# Patient Record
Sex: Female | Born: 1960 | Race: White | Hispanic: No | Smoking: Former smoker
Health system: Southern US, Community
[De-identification: ages and names within clinical notes are randomized; demographics above are authoritative.]

## PROBLEM LIST (undated history)

## (undated) DIAGNOSIS — E785 Hyperlipidemia, unspecified: Secondary | ICD-10-CM

## (undated) DIAGNOSIS — F329 Major depressive disorder, single episode, unspecified: Secondary | ICD-10-CM

## (undated) DIAGNOSIS — F32A Depression, unspecified: Secondary | ICD-10-CM

## (undated) DIAGNOSIS — K219 Gastro-esophageal reflux disease without esophagitis: Secondary | ICD-10-CM

## (undated) HISTORY — DX: Gastro-esophageal reflux disease without esophagitis: K21.9

## (undated) HISTORY — DX: Major depressive disorder, single episode, unspecified: F32.9

## (undated) HISTORY — PX: TONSILLECTOMY: SUR1361

## (undated) HISTORY — DX: Depression, unspecified: F32.A

## (undated) HISTORY — DX: Hyperlipidemia, unspecified: E78.5

---

## 2005-11-10 ENCOUNTER — Ambulatory Visit: Payer: Self-pay

## 2008-04-03 ENCOUNTER — Ambulatory Visit: Payer: Self-pay

## 2009-04-18 ENCOUNTER — Ambulatory Visit: Payer: Self-pay | Admitting: Certified Nurse Midwife

## 2009-04-24 ENCOUNTER — Ambulatory Visit: Payer: Self-pay | Admitting: Certified Nurse Midwife

## 2010-10-14 ENCOUNTER — Ambulatory Visit: Payer: Self-pay | Admitting: Family Medicine

## 2011-02-14 ENCOUNTER — Ambulatory Visit: Payer: Self-pay | Admitting: Internal Medicine

## 2011-08-24 HISTORY — PX: COLONOSCOPY: SHX174

## 2011-11-10 ENCOUNTER — Ambulatory Visit: Payer: Self-pay

## 2013-05-16 ENCOUNTER — Ambulatory Visit: Payer: Self-pay

## 2013-06-15 ENCOUNTER — Ambulatory Visit: Payer: Self-pay

## 2013-08-23 HISTORY — PX: CHOLECYSTECTOMY, LAPAROSCOPIC: SHX56

## 2013-08-31 ENCOUNTER — Ambulatory Visit: Payer: Self-pay | Admitting: Unknown Physician Specialty

## 2013-09-03 LAB — PATHOLOGY REPORT

## 2015-03-20 ENCOUNTER — Other Ambulatory Visit: Payer: Self-pay | Admitting: Family Medicine

## 2015-03-20 DIAGNOSIS — K219 Gastro-esophageal reflux disease without esophagitis: Secondary | ICD-10-CM

## 2015-03-31 IMAGING — MG MM ADDITIONAL VIEWS AT NO CHARGE
1 series · 3 of 3 positions shown · non-contrast
Comparison: Previous exams.

CLINICAL DATA: Patient presents for additional views of the left
breast as followup to a recent screening mammogram suggesting a
possible mass.

EXAM:
MAMMOGRAPHIC ADDITIONAL VIEWS
Left

[L CC · left · 3 of 3 slices shown]
[im 1/3]
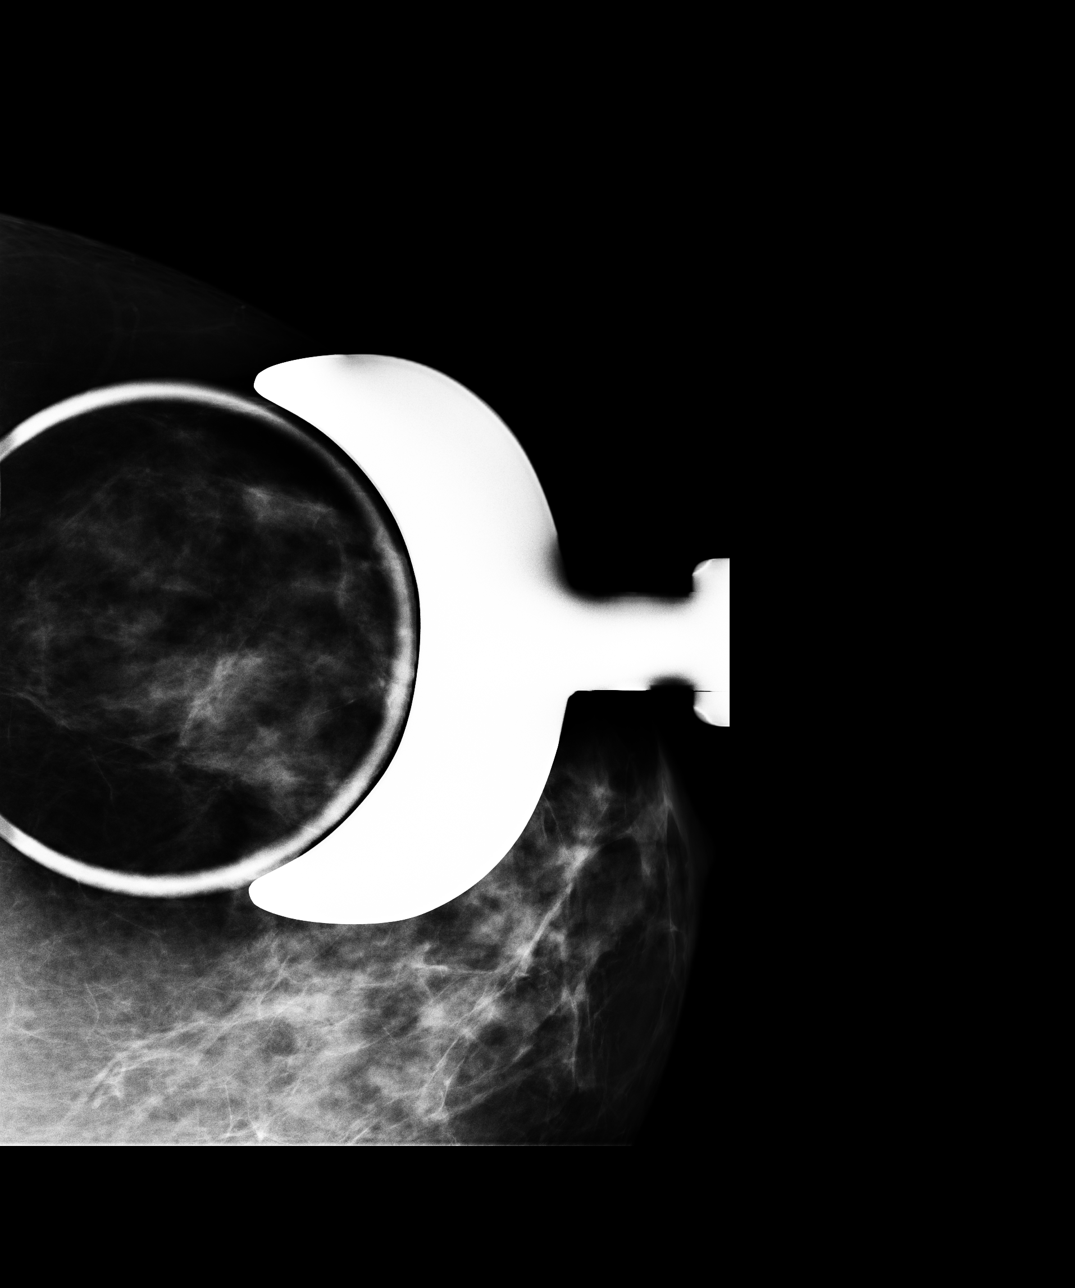
[im 2/3]
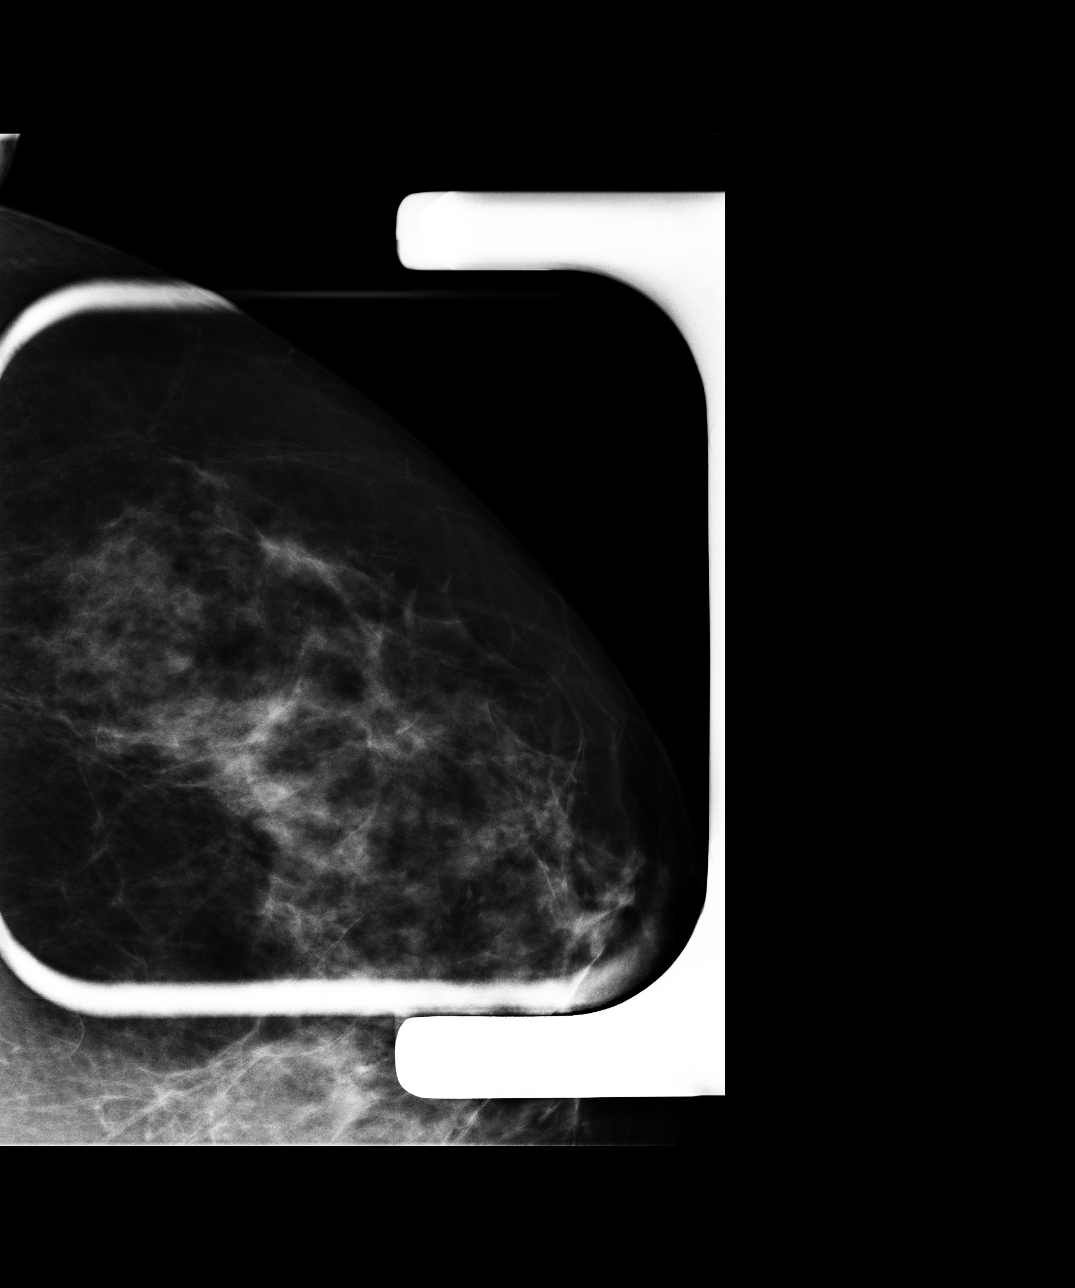
[im 3/3]
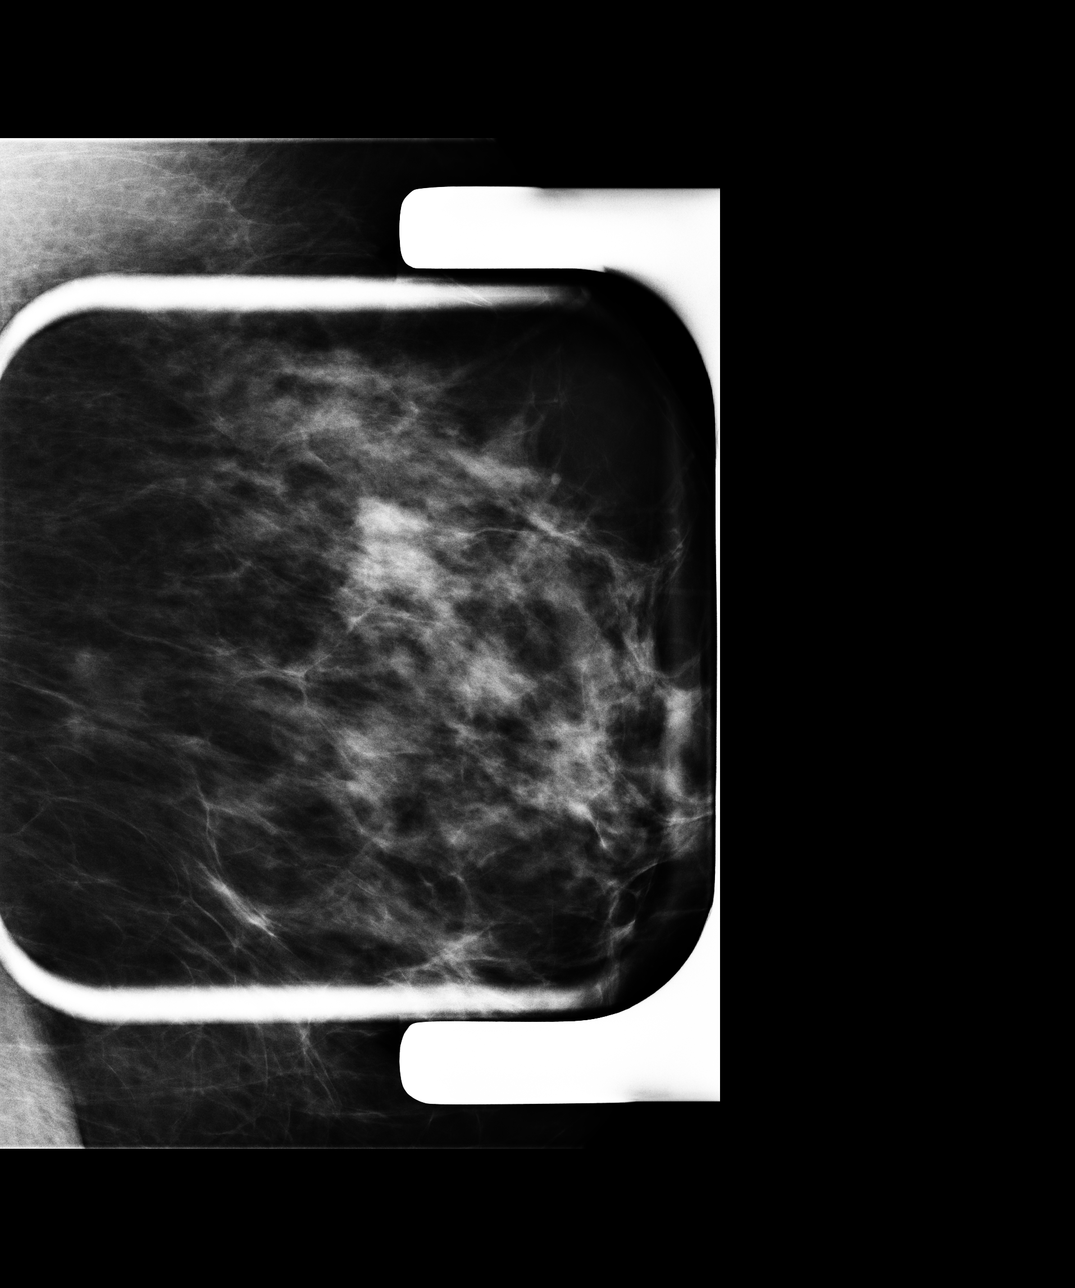

[3 of 3 positions shown; findings below may reference images not displayed]

ACR Breast Density Category b: There are scattered areas of
fibroglandular density.
FINDINGS: Spot compression views as well as a true lateral view demonstrate no
definite focal abnormality over the upper-outer left breast as
findings are likely due to summation artifact.
IMPRESSION: No focal abnormality over the upper outer left breast.

RECOMMENDATION:
Recommend continued annual bilateral screening mammographic
followup.

I have discussed the findings and recommendations with the patient.
Results were also provided in writing at the conclusion of the
visit. If applicable, a reminder letter will be sent to the patient
regarding the next appointment.

BI-RADS CATEGORY  1: Negative

## 2015-04-28 ENCOUNTER — Other Ambulatory Visit: Payer: Self-pay | Admitting: Family Medicine

## 2015-04-28 DIAGNOSIS — K219 Gastro-esophageal reflux disease without esophagitis: Secondary | ICD-10-CM

## 2015-06-06 ENCOUNTER — Ambulatory Visit (INDEPENDENT_AMBULATORY_CARE_PROVIDER_SITE_OTHER): Payer: Self-pay

## 2015-06-06 DIAGNOSIS — Z23 Encounter for immunization: Secondary | ICD-10-CM

## 2015-08-01 ENCOUNTER — Encounter: Payer: Self-pay | Admitting: Family Medicine

## 2015-08-01 ENCOUNTER — Ambulatory Visit (INDEPENDENT_AMBULATORY_CARE_PROVIDER_SITE_OTHER): Payer: Self-pay | Admitting: Family Medicine

## 2015-08-01 VITALS — BP 138/80 | HR 76 | Ht 63.0 in | Wt 217.0 lb

## 2015-08-01 DIAGNOSIS — Z6221 Child in welfare custody: Secondary | ICD-10-CM

## 2015-08-01 DIAGNOSIS — Z0000 Encounter for general adult medical examination without abnormal findings: Secondary | ICD-10-CM

## 2015-08-01 NOTE — Progress Notes (Signed)
Name: Brittany Patton   MRN: [id number]    DOB: [birth year]   Date:08/01/2015       Progress Note  Subjective  Chief Complaint  Chief Complaint  Patient presents with  . Employment Physical    HPI Comments: Patient presents for employment physical.   No problem-specific assessment & plan notes found for this encounter.   Past Medical History  Diagnosis Date  . Hyperlipidemia   . GERD (gastroesophageal reflux disease)     Past Surgical History  Procedure Laterality Date  . Cesarean section      x 2  . Tonsillectomy    . Colonoscopy  2013    cleared for 10 yrs    Family History  Problem Relation Age of Onset  . Diabetes Mother   . Hypertension Mother     Social History   Social History  . Marital Status: Married    Spouse Name: N/A  . Number of Children: N/A  . Years of Education: N/A   Occupational History  . Not on file.   Social History Main Topics  . Smoking status: Former Games developer  . Smokeless tobacco: Not on file  . Alcohol Use: 0.0 oz/week    0 Standard drinks or equivalent per week  . Drug Use: No  . Sexual Activity: Yes   Other Topics Concern  . Not on file   Social History Narrative  . No narrative on file    Allergies  Allergen Reactions  . Sulfa Antibiotics Hives     Review of Systems  Constitutional: Positive for malaise/fatigue. Negative for fever, chills and weight loss.  HENT: Negative for ear discharge, ear pain and sore throat.   Eyes: Negative for blurred vision.  Respiratory: Negative for cough, sputum production, shortness of breath and wheezing.   Cardiovascular: Negative for chest pain, palpitations and leg swelling.  Gastrointestinal: Negative for heartburn, nausea, abdominal pain, diarrhea, constipation, blood in stool and melena.  Genitourinary: Negative for dysuria, urgency, frequency and hematuria.  Musculoskeletal: Negative for myalgias, back pain, joint pain and neck pain.  Skin: Negative for rash.  Neurological:  Negative for dizziness, tingling, sensory change, focal weakness and headaches.  Endo/Heme/Allergies: Negative for environmental allergies and polydipsia. Does not bruise/bleed easily.  Psychiatric/Behavioral: Negative for depression and suicidal ideas. The patient is not nervous/anxious and does not have insomnia.      Objective  Filed Vitals:   08/01/15 1412  BP: 138/80  Pulse: 76  Height:  (1.6 m)  Weight: 217 lb (98.431 kg)    Physical Exam  Constitutional: She is well-developed, well-nourished, and in no distress. No distress.  HENT:  Head: Normocephalic and atraumatic.  Right Ear: External ear normal.  Left Ear: External ear normal.  Nose: Nose normal.  Mouth/Throat: Oropharynx is clear and moist.  Eyes: Conjunctivae and EOM are normal. Pupils are equal, round, and reactive to light. Right eye exhibits no discharge. Left eye exhibits no discharge.  Neck: Normal range of motion. Neck supple. No JVD present. No thyromegaly present.  Cardiovascular: Normal rate, regular rhythm, normal heart sounds and intact distal pulses.  Exam reveals no gallop and no friction rub.   No murmur heard. Pulmonary/Chest: Effort normal and breath sounds normal.  Abdominal: Soft. Bowel sounds are normal. She exhibits no mass. There is no tenderness. There is no guarding.  Musculoskeletal: Normal range of motion. She exhibits no edema.  Lymphadenopathy:    She has no cervical adenopathy.  Neurological: She is alert. She has  normal reflexes.  Skin: Skin is warm and dry. She is not diaphoretic.  Psychiatric: Mood and affect normal.      Assessment & Plan  Problem List Items Addressed This Visit    None    Visit Diagnoses    Foster care (status)    -  Primary         Dr. Elizabeth Sauereanna Jones [facility]  08/01/2015

## 2015-08-13 ENCOUNTER — Other Ambulatory Visit: Payer: Self-pay | Admitting: Family Medicine

## 2015-09-12 ENCOUNTER — Ambulatory Visit (INDEPENDENT_AMBULATORY_CARE_PROVIDER_SITE_OTHER): Payer: BLUE CROSS/BLUE SHIELD | Admitting: Family Medicine

## 2015-09-12 ENCOUNTER — Encounter: Payer: Self-pay | Admitting: Family Medicine

## 2015-09-12 VITALS — BP 140/80 | HR 68 | Ht 63.0 in | Wt 219.0 lb

## 2015-09-12 DIAGNOSIS — R5383 Other fatigue: Secondary | ICD-10-CM

## 2015-09-12 DIAGNOSIS — Z1159 Encounter for screening for other viral diseases: Secondary | ICD-10-CM | POA: Diagnosis not present

## 2015-09-12 DIAGNOSIS — R69 Illness, unspecified: Secondary | ICD-10-CM

## 2015-09-12 DIAGNOSIS — E785 Hyperlipidemia, unspecified: Secondary | ICD-10-CM | POA: Diagnosis not present

## 2015-09-12 DIAGNOSIS — F329 Major depressive disorder, single episode, unspecified: Secondary | ICD-10-CM | POA: Diagnosis not present

## 2015-09-12 DIAGNOSIS — Z1239 Encounter for other screening for malignant neoplasm of breast: Secondary | ICD-10-CM

## 2015-09-12 DIAGNOSIS — Z23 Encounter for immunization: Secondary | ICD-10-CM

## 2015-09-12 DIAGNOSIS — F32A Depression, unspecified: Secondary | ICD-10-CM

## 2015-09-12 NOTE — Progress Notes (Signed)
Name: Brittany Patton   MRN: [id number]    DOB: 1960/09/22   Date:09/12/2015       Progress Note  Subjective  Chief Complaint  Chief Complaint  Patient presents with  . Hyperlipidemia    needs recheck on labs  . Depression    want to sleep all the time, "don't feel good"    Hyperlipidemia This is a chronic problem. The current episode started more than 1 year ago. The problem is controlled. Exacerbating diseases include obesity. She has no history of chronic renal disease, diabetes, hypothyroidism, liver disease or nephrotic syndrome. There are no known factors aggravating her hyperlipidemia. Associated symptoms include myalgias. Pertinent negatives include no chest pain, focal sensory loss, focal weakness, leg pain or shortness of breath. (Episodic legs ache) Current antihyperlipidemic treatment includes statins. The current treatment provides mild improvement of lipids. Compliance problems include medication side effects.  Risk factors for coronary artery disease include dyslipidemia.  Depression        This is a new problem.  The current episode started more than 1 month ago.   The onset quality is gradual.   The problem occurs intermittently.  The problem has been waxing and waning since onset.  Associated symptoms include fatigue, decreased interest and myalgias.  Associated symptoms include does not have insomnia, no headaches and no suicidal ideas.( anxiety)     The symptoms are aggravated by family issues.  Past treatments include nothing.  Compliance with treatment is good.   Pertinent negatives include no hypothyroidism.   No problem-specific assessment & plan notes found for this encounter.   Past Medical History  Diagnosis Date  . Hyperlipidemia   . GERD (gastroesophageal reflux disease)     Past Surgical History  Procedure Laterality Date  . Cesarean section      x 2  . Tonsillectomy    . Colonoscopy  2013    cleared for 10 yrs    Family History  Problem Relation Age  of Onset  . Diabetes Mother   . Hypertension Mother     Social History   Social History  . Marital Status: Married    Spouse Name: N/A  . Number of Children: N/A  . Years of Education: N/A   Occupational History  . Not on file.   Social History Main Topics  . Smoking status: Former Games developer  . Smokeless tobacco: Not on file  . Alcohol Use: 0.0 oz/week    0 Standard drinks or equivalent per week  . Drug Use: No  . Sexual Activity: Yes   Other Topics Concern  . Not on file   Social History Narrative    Allergies  Allergen Reactions  . Sulfa Antibiotics Hives     Review of Systems  Constitutional: Positive for fatigue. Negative for fever, chills, weight loss and malaise/fatigue.  HENT: Negative for ear discharge, ear pain and sore throat.   Eyes: Negative for blurred vision.  Respiratory: Negative for cough, sputum production, shortness of breath and wheezing.   Cardiovascular: Negative for chest pain, palpitations and leg swelling.  Gastrointestinal: Negative for heartburn, nausea, abdominal pain, diarrhea, constipation, blood in stool and melena.  Genitourinary: Negative for dysuria, urgency, frequency and hematuria.  Musculoskeletal: Positive for myalgias. Negative for back pain, joint pain and neck pain.  Skin: Negative for rash.  Neurological: Negative for dizziness, tingling, sensory change, focal weakness and headaches.  Endo/Heme/Allergies: Negative for environmental allergies and polydipsia. Does not bruise/bleed easily.  Psychiatric/Behavioral: Positive for depression. Negative  for suicidal ideas. The patient is not nervous/anxious and does not have insomnia.      Objective  Filed Vitals:   09/12/15 0813  BP: 140/80  Pulse: 68  Height:  (1.6 m)  Weight: 219 lb (99.338 kg)    Physical Exam  Constitutional: She is well-developed, well-nourished, and in no distress. No distress.  HENT:  Head: Normocephalic and atraumatic.  Right Ear: External  ear normal.  Left Ear: External ear normal.  Nose: Nose normal.  Mouth/Throat: Oropharynx is clear and moist.  Eyes: Conjunctivae and EOM are normal. Pupils are equal, round, and reactive to light. Right eye exhibits no discharge. Left eye exhibits no discharge.  Neck: Normal range of motion. Neck supple. No JVD present. No thyromegaly present.  Cardiovascular: Normal rate, regular rhythm, normal heart sounds and intact distal pulses.  Exam reveals no gallop and no friction rub.   No murmur heard. Pulmonary/Chest: Effort normal and breath sounds normal. Right breast exhibits no inverted nipple, no mass, no nipple discharge, no skin change and no tenderness. Left breast exhibits no inverted nipple, no mass, no nipple discharge, no skin change and no tenderness. Breasts are symmetrical.  Abdominal: Soft. Bowel sounds are normal. She exhibits no mass. There is no tenderness. There is no guarding.  Musculoskeletal: Normal range of motion. She exhibits no edema.  Lymphadenopathy:    She has no cervical adenopathy.  Neurological: She is alert.  Skin: Skin is warm and dry. She is not diaphoretic.  Psychiatric: Mood and affect normal.      Assessment & Plan  Problem List Items Addressed This Visit    None    Visit Diagnoses    Hyperlipidemia    -  Primary    Other fatigue        Relevant Orders    TSH    CBC w/Diff/Platelet    Lipid Profile    Hepatic function panel    Hepatitis C Antibody    Taking medication for chronic disease        Relevant Orders    Hepatic function panel    Need for hepatitis C screening test        Relevant Orders    Hepatitis C Antibody    Breast cancer screening        Relevant Orders    MM Digital Screening    Immunization due        Relevant Orders    Tdap vaccine greater than or equal to 7yo IM (Completed)         Dr. [facility]den [facility]  09/12/2015

## 2015-09-13 LAB — CBC WITH DIFFERENTIAL/PLATELET
BASOS: 1 %
Basophils Absolute: 0.1 10*3/uL (ref 0.0–0.2)
EOS (ABSOLUTE): 0.2 10*3/uL (ref 0.0–0.4)
Eos: 2 %
HEMOGLOBIN: 14.5 g/dL (ref 11.1–15.9)
Hematocrit: 42.3 % (ref 34.0–46.6)
IMMATURE GRANS (ABS): 0 10*3/uL (ref 0.0–0.1)
Immature Granulocytes: 0 %
Lymphocytes Absolute: 2 10*3/uL (ref 0.7–3.1)
Lymphs: 21 %
MCH: 28.2 pg (ref 26.6–33.0)
MCHC: 34.3 g/dL (ref 31.5–35.7)
MCV: 82 fL (ref 79–97)
MONOCYTES: 8 %
Monocytes Absolute: 0.8 10*3/uL (ref 0.1–0.9)
NEUTROS ABS: 6.4 10*3/uL (ref 1.4–7.0)
NEUTROS PCT: 68 %
PLATELETS: 426 10*3/uL — AB (ref 150–379)
RBC: 5.14 x10E6/uL (ref 3.77–5.28)
RDW: 13.9 % (ref 12.3–15.4)
WBC: 9.5 10*3/uL (ref 3.4–10.8)

## 2015-09-13 LAB — HEPATIC FUNCTION PANEL
ALBUMIN: 4.4 g/dL (ref 3.5–5.5)
ALK PHOS: 119 IU/L — AB (ref 39–117)
ALT: 13 IU/L (ref 0–32)
AST: 17 IU/L (ref 0–40)
Bilirubin Total: 0.8 mg/dL (ref 0.0–1.2)
Bilirubin, Direct: 0.16 mg/dL (ref 0.00–0.40)
TOTAL PROTEIN: 7.1 g/dL (ref 6.0–8.5)

## 2015-09-13 LAB — LIPID PANEL
CHOL/HDL RATIO: 5.1 ratio — AB (ref 0.0–4.4)
Cholesterol, Total: 253 mg/dL — ABNORMAL HIGH (ref 100–199)
HDL: 50 mg/dL (ref >39)
LDL CALC: 145 mg/dL — AB (ref 0–99)
Triglycerides: 292 mg/dL — ABNORMAL HIGH (ref 0–149)
VLDL CHOLESTEROL CAL: 58 mg/dL — AB (ref 5–40)

## 2015-09-13 LAB — HEPATITIS C ANTIBODY: Hep C Virus Ab: <0.1 s/co ratio (ref 0.0–0.9)

## 2015-09-13 LAB — TSH: TSH: 1.11 u[IU]/mL (ref 0.450–4.500)

## 2015-09-13 LAB — GLUCOSE, RANDOM: GLUCOSE: 102 mg/dL — AB (ref 65–99)

## 2015-09-15 MED ORDER — SERTRALINE HCL 50 MG PO TABS: 50.0000 mg | ORAL_TABLET | Freq: Every day | ORAL | Status: DC

## 2015-09-15 NOTE — Addendum Note (Signed)
Addended by: Everitt Amber on: 09/15/2015 11:57 AM   Modules accepted: Orders

## 2015-09-16 ENCOUNTER — Ambulatory Visit: Payer: Self-pay

## 2015-10-11 ENCOUNTER — Other Ambulatory Visit: Payer: Self-pay | Admitting: Family Medicine

## 2015-10-15 ENCOUNTER — Encounter: Payer: Self-pay | Admitting: Family Medicine

## 2015-10-15 ENCOUNTER — Ambulatory Visit (INDEPENDENT_AMBULATORY_CARE_PROVIDER_SITE_OTHER): Payer: BLUE CROSS/BLUE SHIELD | Admitting: Family Medicine

## 2015-10-15 VITALS — BP 120/80 | HR 80 | Ht 63.0 in | Wt 223.0 lb

## 2015-10-15 DIAGNOSIS — F32A Depression, unspecified: Secondary | ICD-10-CM | POA: Insufficient documentation

## 2015-10-15 DIAGNOSIS — F329 Major depressive disorder, single episode, unspecified: Secondary | ICD-10-CM

## 2015-10-15 MED ORDER — SERTRALINE HCL 50 MG PO TABS: ORAL_TABLET | ORAL | Status: DC

## 2015-10-15 NOTE — Progress Notes (Signed)
Name: Brittany Patton   MRN: [id number]    DOB: 07/30/1961   Date:10/15/2015       Progress Note  Subjective  Chief Complaint  Chief Complaint  Patient presents with  . Follow-up    depression- started Sertraline- crying spells have improved but is still having some break through spells    Depression        This is a new problem.  The current episode started 1 to 4 weeks ago.   The problem occurs daily.  The problem has been gradually improving since onset.  Associated symptoms include decreased concentration, fatigue, restlessness, decreased interest and sad.  Associated symptoms include no helplessness, no hopelessness, does not have insomnia, not irritable, no appetite change, no myalgias, no headaches and no suicidal ideas.     The symptoms are aggravated by family issues.  Past treatments include SSRIs - Selective serotonin reuptake inhibitors.   Pertinent negatives include no chronic pain and no fibromyalgia.   No problem-specific assessment & plan notes found for this encounter.   Past Medical History  Diagnosis Date  . Hyperlipidemia   . GERD (gastroesophageal reflux disease)   . Depression     Past Surgical History  Procedure Laterality Date  . Cesarean section      x 2  . Tonsillectomy    . Colonoscopy  2013    cleared for 10 yrs    Family History  Problem Relation Age of Onset  . Diabetes Mother   . Hypertension Mother     Social History   Social History  . Marital Status: Married    Spouse Name: N/A  . Number of Children: N/A  . Years of Education: N/A   Occupational History  . Not on file.   Social History Main Topics  . Smoking status: Former Games developer  . Smokeless tobacco: Not on file  . Alcohol Use: 0.0 oz/week    0 Standard drinks or equivalent per week  . Drug Use: No  . Sexual Activity: Yes   Other Topics Concern  . Not on file   Social History Narrative    Allergies  Allergen Reactions  . Sulfa Antibiotics Hives     Review of  Systems  Constitutional: Positive for fatigue. Negative for fever, chills, weight loss, malaise/fatigue and appetite change.  HENT: Negative for ear discharge, ear pain and sore throat.   Eyes: Negative for blurred vision.  Respiratory: Negative for cough, sputum production, shortness of breath and wheezing.   Cardiovascular: Negative for chest pain, palpitations and leg swelling.  Gastrointestinal: Negative for heartburn, nausea, abdominal pain, diarrhea, constipation, blood in stool and melena.  Genitourinary: Negative for dysuria, urgency, frequency and hematuria.  Musculoskeletal: Negative for myalgias, back pain, joint pain and neck pain.  Skin: Negative for rash.  Neurological: Negative for dizziness, tingling, sensory change, focal weakness and headaches.  Endo/Heme/Allergies: Negative for environmental allergies and polydipsia. Does not bruise/bleed easily.  Psychiatric/Behavioral: Positive for depression and decreased concentration. Negative for suicidal ideas. The patient is not nervous/anxious and does not have insomnia.      Objective  Filed Vitals:   10/15/15 1056  BP: 120/80  Pulse: 80  Height:  (1.6 m)  Weight: 223 lb (101.152 kg)    Physical Exam  Constitutional: She is well-developed, well-nourished, and in no distress. She is not irritable. No distress.  HENT:  Head: Normocephalic and atraumatic.  Right Ear: External ear normal.  Left Ear: External ear normal.  Nose: Nose normal.  Mouth/Throat: Oropharynx is clear and moist.  Eyes: Conjunctivae and EOM are normal. Pupils are equal, round, and reactive to light. Right eye exhibits no discharge. Left eye exhibits no discharge.  Neck: Normal range of motion. Neck supple. No JVD present. No thyromegaly present.  Cardiovascular: Normal rate, regular rhythm, normal heart sounds and intact distal pulses.  Exam reveals no gallop and no friction rub.   No murmur heard. Pulmonary/Chest: Effort normal and breath  sounds normal.  Abdominal: Soft. Bowel sounds are normal. She exhibits no mass. There is no tenderness. There is no guarding.  Musculoskeletal: Normal range of motion. She exhibits no edema.  Lymphadenopathy:    She has no cervical adenopathy.  Neurological: She is alert.  Skin: Skin is warm and dry. She is not diaphoretic.  Psychiatric: Mood and affect normal.  Nursing note and vitals reviewed.     Assessment & Plan  Problem List Items Addressed This Visit      Other   Depression - Primary   Relevant Medications   sertraline (ZOLOFT) 50 MG tablet        Dr. [facility]  10/15/2015

## 2015-12-01 ENCOUNTER — Other Ambulatory Visit: Payer: Self-pay | Admitting: Family Medicine

## 2016-03-08 ENCOUNTER — Other Ambulatory Visit: Payer: Self-pay | Admitting: Family Medicine

## 2016-03-11 ENCOUNTER — Other Ambulatory Visit: Payer: Self-pay

## 2016-03-11 DIAGNOSIS — F329 Major depressive disorder, single episode, unspecified: Secondary | ICD-10-CM

## 2016-03-11 DIAGNOSIS — F32A Depression, unspecified: Secondary | ICD-10-CM

## 2016-03-11 MED ORDER — SERTRALINE HCL 50 MG PO TABS
ORAL_TABLET | ORAL | Status: DC
Start: 2016-03-11 — End: 2016-04-13

## 2016-03-11 NOTE — Telephone Encounter (Signed)
Pharmacy would like a new rx for a 90 day supply.

## 2016-03-11 NOTE — Telephone Encounter (Signed)
pts coming in on 8/22 does pt need to be seen sooner?

## 2016-04-13 ENCOUNTER — Encounter: Payer: Self-pay | Admitting: Family Medicine

## 2016-04-13 ENCOUNTER — Ambulatory Visit (INDEPENDENT_AMBULATORY_CARE_PROVIDER_SITE_OTHER): Payer: BLUE CROSS/BLUE SHIELD | Admitting: Family Medicine

## 2016-04-13 VITALS — BP 110/62 | HR 72 | Ht 63.0 in | Wt 214.0 lb

## 2016-04-13 DIAGNOSIS — Z23 Encounter for immunization: Secondary | ICD-10-CM

## 2016-04-13 DIAGNOSIS — E785 Hyperlipidemia, unspecified: Secondary | ICD-10-CM

## 2016-04-13 DIAGNOSIS — F329 Major depressive disorder, single episode, unspecified: Secondary | ICD-10-CM | POA: Diagnosis not present

## 2016-04-13 DIAGNOSIS — K219 Gastro-esophageal reflux disease without esophagitis: Secondary | ICD-10-CM | POA: Diagnosis not present

## 2016-04-13 DIAGNOSIS — F32A Depression, unspecified: Secondary | ICD-10-CM

## 2016-04-13 MED ORDER — FAMOTIDINE 40 MG PO TABS: 40.0000 mg | ORAL_TABLET | Freq: Every day | ORAL | 2 refills | Status: DC

## 2016-04-13 MED ORDER — SIMVASTATIN 20 MG PO TABS: 20.0000 mg | ORAL_TABLET | Freq: Every day | ORAL | 2 refills | Status: DC

## 2016-04-13 NOTE — Progress Notes (Signed)
Name: Brittany Patton   MRN: [id number]    DOB: 1961/06/24   Date:04/13/2016       Progress Note  Subjective  Chief Complaint  Chief Complaint  Patient presents with  . Hyperlipidemia  . Gastroesophageal Reflux  . Depression    Hyperlipidemia  This is a chronic problem. The current episode started more than 1 year ago. The problem is controlled. Recent lipid tests were reviewed and are normal. She has no history of chronic renal disease, hypothyroidism, liver disease, obesity or nephrotic syndrome. There are no known factors aggravating her hyperlipidemia. Pertinent negatives include no chest pain, focal sensory loss, focal weakness, leg pain, myalgias or shortness of breath. The current treatment provides moderate improvement of lipids. There are no compliance problems.  There are no known risk factors for coronary artery disease.  Gastroesophageal Reflux  She reports no abdominal pain, no belching, no chest pain, no choking, no coughing, no dysphagia, no early satiety, no globus sensation, no heartburn, no hoarse voice, no nausea, no sore throat, no stridor, no tooth decay, no water brash or no wheezing. This is a chronic problem. The current episode started more than 1 year ago. The problem occurs occasionally. The problem has been gradually improving. The symptoms are aggravated by certain foods. Pertinent negatives include no anemia, fatigue, melena, muscle weakness, orthopnea or weight loss. Risk factors include obesity. She has tried a PPI for the symptoms. The treatment provided moderate relief.  Depression         This is a chronic problem.  The current episode started more than 1 year ago.   The onset quality is gradual.   The problem occurs daily.The problem is unchanged (desires to come off medication).  Associated symptoms include no decreased concentration, no fatigue, no helplessness, no hopelessness, does not have insomnia, not irritable, no restlessness, no decreased interest, no  appetite change, no body aches, no myalgias, no headaches, no indigestion, not sad and no suicidal ideas.     The symptoms are aggravated by nothing.  Past treatments include SSRIs - Selective serotonin reuptake inhibitors.  Compliance with treatment is good.  Previous treatment provided moderate relief.   Pertinent negatives include no hypothyroidism.   No problem-specific Assessment & Plan notes found for this encounter.   Past Medical History:  Diagnosis Date  . Depression   . GERD (gastroesophageal reflux disease)   . Hyperlipidemia     Past Surgical History:  Procedure Laterality Date  . CESAREAN SECTION     x 2  . COLONOSCOPY  2013   cleared for 10 yrs  . TONSILLECTOMY      Family History  Problem Relation Age of Onset  . Diabetes Mother   . Hypertension Mother     Social History   Social History  . Marital status: Married    Spouse name: N/A  . Number of children: N/A  . Years of education: N/A   Occupational History  . Not on file.   Social History Main Topics  . Smoking status: Former Games developermoker  . Smokeless tobacco: Never Used  . Alcohol use 0.0 oz/week  . Drug use: No  . Sexual activity: Yes   Other Topics Concern  . Not on file   Social History Narrative  . No narrative on file    Allergies  Allergen Reactions  . Sulfa Antibiotics Hives     Review of Systems  Constitutional: Negative for appetite change, chills, fatigue, fever, malaise/fatigue and weight loss.  HENT:  Negative for ear discharge, ear pain, hoarse voice and sore throat.   Eyes: Negative for blurred vision.  Respiratory: Negative for cough, sputum production, choking, shortness of breath and wheezing.   Cardiovascular: Negative for chest pain, palpitations and leg swelling.  Gastrointestinal: Negative for abdominal pain, blood in stool, constipation, diarrhea, dysphagia, heartburn, melena and nausea.  Genitourinary: Negative for dysuria, frequency, hematuria and urgency.   Musculoskeletal: Negative for back pain, joint pain, myalgias, muscle weakness and neck pain.  Skin: Negative for rash.  Neurological: Negative for dizziness, tingling, sensory change, focal weakness and headaches.  Endo/Heme/Allergies: Negative for environmental allergies and polydipsia. Does not bruise/bleed easily.  Psychiatric/Behavioral: Positive for depression. Negative for decreased concentration and suicidal ideas. The patient is not nervous/anxious and does not have insomnia.      Objective  Vitals:   04/13/16 0909  BP: 110/62  Pulse: 72  Weight: 214 lb (97.1 kg)  Height: 5\' 3"  (1.6 m)    Physical Exam  Constitutional: She is well-developed, well-nourished, and in no distress. She is not irritable. No distress.  HENT:  Head: Normocephalic and atraumatic.  Right Ear: External ear normal.  Left Ear: External ear normal.  Nose: Nose normal.  Mouth/Throat: Oropharynx is clear and moist.  Eyes: Conjunctivae and EOM are normal. Pupils are equal, round, and reactive to light. Right eye exhibits no discharge. Left eye exhibits no discharge.  Neck: Normal range of motion. Neck supple. No JVD present. No thyromegaly present.  Cardiovascular: Normal rate, regular rhythm, normal heart sounds and intact distal pulses.  Exam reveals no gallop and no friction rub.   No murmur heard. Pulmonary/Chest: Effort normal and breath sounds normal.  Abdominal: Soft. Bowel sounds are normal. She exhibits no mass. There is no tenderness. There is no guarding.  Musculoskeletal: Normal range of motion. She exhibits no edema.  Lymphadenopathy:    She has no cervical adenopathy.  Neurological: She is alert.  Skin: Skin is warm and dry. She is not diaphoretic.  Psychiatric: Mood and affect normal.  Nursing note and vitals reviewed.     Assessment & Plan  Problem List Items Addressed This Visit      Digestive   Esophageal reflux   Relevant Medications   famotidine (PEPCID) 40 MG tablet      Other   RESOLVED: Depression   Hyperlipidemia - Primary   Relevant Medications   simvastatin (ZOCOR) 20 MG tablet    Other Visit Diagnoses    Need for influenza vaccination       Relevant Orders   Flu Vaccine QUAD 36+ mos PF IM (Fluarix & Fluzone Quad PF) (Completed)        Dr. Hayden Rasmusseneanna Jones [facility]  04/13/16

## 2016-05-27 ENCOUNTER — Other Ambulatory Visit: Payer: Self-pay

## 2016-06-01 ENCOUNTER — Other Ambulatory Visit: Payer: Self-pay

## 2016-06-01 ENCOUNTER — Telehealth: Payer: Self-pay

## 2016-06-01 NOTE — Telephone Encounter (Signed)
Pt called and said she needed something for sleep due to husband's death. Called in Alprazolam 0.25mg  1 qday # 30 with 0 refills to [facility] [place]. Told pt to start back on Sertraline to take along with Xanax. She will start on 1/2 tab qday x 10 days and bump up to a whole tab of Zoloft and eventually use xanax as needed once Zoloft gets in system- pt aware

## 2016-10-12 ENCOUNTER — Ambulatory Visit (INDEPENDENT_AMBULATORY_CARE_PROVIDER_SITE_OTHER): Payer: BLUE CROSS/BLUE SHIELD | Admitting: Family Medicine

## 2016-10-12 VITALS — BP 138/82 | HR 80 | Ht 63.0 in | Wt 213.0 lb

## 2016-10-12 DIAGNOSIS — Z0000 Encounter for general adult medical examination without abnormal findings: Secondary | ICD-10-CM | POA: Diagnosis not present

## 2016-10-12 NOTE — Progress Notes (Signed)
Name: Brittany Patton   MRN: [id number]    DOB: [birth year]   Date:10/12/2016       Progress Note  Subjective  Chief Complaint  Chief Complaint  Patient presents with  . paperwork    form filled out for son's care    Patient present for [facility] medical form completion. No communicable disease,physical limitations, nor behavior issues. Current medical condition stable.  No documented risk for tuberculosis.    No problem-specific Assessment & Plan notes found for this encounter.   Past Medical History:  Diagnosis Date  . Depression   . GERD (gastroesophageal reflux disease)   . Hyperlipidemia     Past Surgical History:  Procedure Laterality Date  . CESAREAN SECTION     x 2  . COLONOSCOPY  2013   cleared for 10 yrs  . TONSILLECTOMY      Family History  Problem Relation Age of Onset  . Diabetes Mother   . Hypertension Mother     Social History   Social History  . Marital status: Married    Spouse name: N/A  . Number of children: N/A  . Years of education: N/A   Occupational History  . Not on file.   Social History Main Topics  . Smoking status: Former Games developer  . Smokeless tobacco: Never Used  . Alcohol use 0.0 oz/week  . Drug use: No  . Sexual activity: Yes   Other Topics Concern  . Not on file   Social History Narrative  . No narrative on file    Allergies  Allergen Reactions  . Sulfa Antibiotics Hives    Outpatient Medications Prior to Visit  Medication Sig Dispense Refill  . famotidine (PEPCID) 40 MG tablet Take 1 tablet (40 mg total) by mouth daily. 90 tablet 2  . simvastatin (ZOCOR) 20 MG tablet Take 1 tablet (20 mg total) by mouth daily. 90 tablet 2   No facility-administered medications prior to visit.     Review of Systems  Constitutional: Negative for malaise/fatigue and weight loss.  HENT: Negative for ear discharge and ear pain.   Eyes: Negative for blurred vision.  Respiratory: Negative for sputum production, shortness of breath and  wheezing.   Cardiovascular: Negative for palpitations and leg swelling.  Gastrointestinal: Negative for blood in stool, constipation, diarrhea, heartburn and melena.  Genitourinary: Negative for dysuria, frequency, hematuria and urgency.  Musculoskeletal: Negative for back pain and joint pain.  Neurological: Negative for dizziness, tingling, sensory change and focal weakness.  Endo/Heme/Allergies: Negative for environmental allergies and polydipsia. Does not bruise/bleed easily.  Psychiatric/Behavioral: Negative for depression and suicidal ideas. The patient is not nervous/anxious and does not have insomnia.      Objective  Vitals:   10/12/16 0901  BP: 138/82  Pulse: 80  Weight: 213 lb (96.6 kg)  Height: 5\' 3"  (1.6 m)    Physical Exam  Constitutional: She is well-developed, well-nourished, and in no distress. No distress.  HENT:  Head: Normocephalic and atraumatic.  Right Ear: External ear normal.  Left Ear: External ear normal.  Nose: Nose normal.  Mouth/Throat: Oropharynx is clear and moist. No oropharyngeal exudate, posterior oropharyngeal edema or posterior oropharyngeal erythema.  Eyes: Conjunctivae and EOM are normal. Pupils are equal, round, and reactive to light. Right eye exhibits no discharge. Left eye exhibits no discharge.  Neck: Normal range of motion. Neck supple. No JVD present. No thyromegaly present.  Cardiovascular: Normal rate, regular rhythm, normal heart sounds and intact distal pulses.  Exam reveals  no gallop and no friction rub.   No murmur heard. Pulmonary/Chest: Effort normal and breath sounds normal. She has no wheezes. She has no rales.  Abdominal: Soft. Bowel sounds are normal. She exhibits no mass. There is no tenderness. There is no guarding.  Musculoskeletal: Normal range of motion. She exhibits no edema.  Lymphadenopathy:    She has no cervical adenopathy.  Neurological: She is alert. She has normal reflexes.  Skin: Skin is warm and dry. She is  not diaphoretic.  Psychiatric: Mood and affect normal.  Nursing note and vitals reviewed.     Assessment & Plan  Problem List Items Addressed This Visit    None    Visit Diagnoses    Adult general medical exam    -  Primary      No orders of the defined types were placed in this encounter.     Dr. Hayden Rasmusseneanna Sera [facility]man [facility]  10/12/16

## 2016-11-01 ENCOUNTER — Ambulatory Visit (INDEPENDENT_AMBULATORY_CARE_PROVIDER_SITE_OTHER): Payer: BLUE CROSS/BLUE SHIELD | Admitting: Family Medicine

## 2016-11-01 ENCOUNTER — Encounter: Payer: Self-pay | Admitting: Family Medicine

## 2016-11-01 VITALS — BP 120/80 | HR 80 | Temp 98.4°F | Ht 63.0 in | Wt 213.0 lb

## 2016-11-01 DIAGNOSIS — J0100 Acute maxillary sinusitis, unspecified: Secondary | ICD-10-CM

## 2016-11-01 DIAGNOSIS — J40 Bronchitis, not specified as acute or chronic: Secondary | ICD-10-CM

## 2016-11-01 MED ORDER — AMOXICILLIN-POT CLAVULANATE 875-125 MG PO TABS: 1.0000 | ORAL_TABLET | Freq: Two times a day (BID) | ORAL | 0 refills | Status: DC

## 2016-11-01 MED ORDER — GUAIFENESIN-CODEINE 100-10 MG/5ML PO SYRP: 5.0000 mL | ORAL_SOLUTION | Freq: Three times a day (TID) | ORAL | 0 refills | Status: DC | PRN

## 2016-11-01 NOTE — Progress Notes (Signed)
Name: Brittany Patton   MRN: [id number]    DOB: 11/06/1960   Date:11/01/2016       Progress Note  Subjective  Chief Complaint  Chief Complaint  Patient presents with  . Sinusitis    achy, fever, sore throat, hoarse, cough with some colored production    Sinusitis  This is a new problem. The current episode started in the past 7 days. The problem has been waxing and waning since onset. The maximum temperature recorded prior to her arrival was 100.4 - 100.9 F. The fever has been present for 3 to 4 days. The pain is mild. Associated symptoms include chills, congestion, coughing, a hoarse voice, sinus pressure, sneezing, a sore throat and swollen glands. Pertinent negatives include no diaphoresis, ear pain, headaches, neck pain or shortness of breath. Past treatments include acetaminophen and oral decongestants. The treatment provided mild relief.  Cough  This is a new problem. The current episode started in the past 7 days. The problem has been gradually worsening. The cough is productive of purulent sputum (green). Associated symptoms include chills and a sore throat. Pertinent negatives include no chest pain, ear pain, fever, headaches, heartburn, hemoptysis, myalgias, rash, shortness of breath, weight loss or wheezing. Nothing aggravates the symptoms. She has tried OTC cough suppressant (alka seltzer cold plus) for the symptoms. The treatment provided mild relief. There is no history of environmental allergies.    No problem-specific Assessment & Plan notes found for this encounter.   Past Medical History:  Diagnosis Date  . Depression   . GERD (gastroesophageal reflux disease)   . Hyperlipidemia     Past Surgical History:  Procedure Laterality Date  . CESAREAN SECTION     x 2  . COLONOSCOPY  2013   cleared for 10 yrs  . TONSILLECTOMY      Family History  Problem Relation Age of Onset  . Diabetes Mother   . Hypertension Mother     Social History   Social History  . Marital  status: Married    Spouse name: N/A  . Number of children: N/A  . Years of education: N/A   Occupational History  . Not on file.   Social History Main Topics  . Smoking status: Former Games developermoker  . Smokeless tobacco: Never Used  . Alcohol use 0.0 oz/week  . Drug use: No  . Sexual activity: Yes   Other Topics Concern  . Not on file   Social History Narrative  . No narrative on file    Allergies  Allergen Reactions  . Sulfa Antibiotics Hives    Outpatient Medications Prior to Visit  Medication Sig Dispense Refill  . famotidine (PEPCID) 40 MG tablet Take 1 tablet (40 mg total) by mouth daily. 90 tablet 2  . simvastatin (ZOCOR) 20 MG tablet Take 1 tablet (20 mg total) by mouth daily. 90 tablet 2   No facility-administered medications prior to visit.     Review of Systems  Constitutional: Positive for chills. Negative for diaphoresis, fever, malaise/fatigue and weight loss.  HENT: Positive for congestion, hoarse voice, sinus pressure, sneezing and sore throat. Negative for ear discharge and ear pain.   Eyes: Negative for blurred vision.  Respiratory: Positive for cough and sputum production. Negative for hemoptysis, shortness of breath and wheezing.   Cardiovascular: Negative for chest pain, palpitations and leg swelling.  Gastrointestinal: Negative for abdominal pain, blood in stool, constipation, diarrhea, heartburn, melena and nausea.  Genitourinary: Negative for dysuria, frequency, hematuria and urgency.  Musculoskeletal: Negative for back pain, joint pain, myalgias and neck pain.  Skin: Negative for rash.  Neurological: Negative for dizziness, tingling, sensory change, focal weakness and headaches.  Endo/Heme/Allergies: Negative for environmental allergies and polydipsia. Does not bruise/bleed easily.  Psychiatric/Behavioral: Negative for depression and suicidal ideas. The patient is not nervous/anxious and does not have insomnia.      Objective  Vitals:   11/01/16  0959  BP: 120/80  Pulse: 80  Temp: 98.4 F (36.9 C)  TempSrc: Oral  Weight: 213 lb (96.6 kg)  Height: 5\' 3"  (1.6 m)    Physical Exam  Constitutional: She is well-developed, well-nourished, and in no distress. No distress.  HENT:  Head: Normocephalic and atraumatic.  Right Ear: Tympanic membrane, external ear and ear canal normal.  Left Ear: Tympanic membrane, external ear and ear canal normal.  Nose: Nose normal. Right sinus exhibits no maxillary sinus tenderness and no frontal sinus tenderness. Left sinus exhibits no maxillary sinus tenderness and no frontal sinus tenderness.  Mouth/Throat: Posterior oropharyngeal erythema present. No oropharyngeal exudate or posterior oropharyngeal edema.  Eyes: Conjunctivae and EOM are normal. Pupils are equal, round, and reactive to light. Right eye exhibits no discharge. Left eye exhibits no discharge.  Neck: Normal range of motion. Neck supple. No JVD present. No thyromegaly present.  Cardiovascular: Normal rate, regular rhythm, normal heart sounds and intact distal pulses.  Exam reveals no gallop and no friction rub.   No murmur heard. Pulmonary/Chest: Effort normal and breath sounds normal. No accessory muscle usage. No respiratory distress. She has no decreased breath sounds. She has no wheezes. She has no rhonchi. She has no rales.  Abdominal: Soft. Bowel sounds are normal. She exhibits no mass. There is no tenderness. There is no guarding.  Musculoskeletal: Normal range of motion. She exhibits no edema.  Lymphadenopathy:       Head (right side): No submental and no submandibular adenopathy present.       Head (left side): No submental and no submandibular adenopathy present.    She has no cervical adenopathy.  Neurological: She is alert. She has normal reflexes.  Skin: Skin is warm, dry and intact. She is not diaphoretic. No pallor.  Psychiatric: Mood and affect normal.      Assessment & Plan  Problem List Items Addressed This Visit     None    Visit Diagnoses    Acute non-recurrent maxillary sinusitis    -  Primary   Relevant Medications   amoxicillin-clavulanate (AUGMENTIN) 875-125 MG tablet   guaiFENesin-codeine (ROBITUSSIN AC) 100-10 MG/5ML syrup   Bronchitis       Relevant Medications   amoxicillin-clavulanate (AUGMENTIN) 875-125 MG tablet   guaiFENesin-codeine (ROBITUSSIN AC) 100-10 MG/5ML syrup      Meds ordered this encounter  Medications  . amoxicillin-clavulanate (AUGMENTIN) 875-125 MG tablet    Sig: Take 1 tablet by mouth 2 (two) times daily.    Dispense:  20 tablet    Refill:  0  . guaiFENesin-codeine (ROBITUSSIN AC) 100-10 MG/5ML syrup    Sig: Take 5 mLs by mouth 3 (three) times daily as needed for cough.    Dispense:  150 mL    Refill:  0      Dr. Elizabeth Sauer [facility]  11/01/16

## 2016-11-04 ENCOUNTER — Other Ambulatory Visit: Payer: Self-pay

## 2016-11-04 ENCOUNTER — Telehealth: Payer: Self-pay

## 2016-11-04 MED ORDER — AZITHROMYCIN 250 MG PO TABS: ORAL_TABLET | ORAL | 0 refills | Status: DC

## 2016-11-04 NOTE — Telephone Encounter (Signed)
Pt called and said the antibiotic (Augmentin) is making her feel "jumpy" and legs aching. Wants to be switched to a different antibiotic. Dr Yetta BarreJones said [facility] to [facility]- sent in to [facility] [place]

## 2017-05-01 ENCOUNTER — Other Ambulatory Visit: Payer: Self-pay | Admitting: Family Medicine

## 2017-05-01 DIAGNOSIS — E785 Hyperlipidemia, unspecified: Secondary | ICD-10-CM

## 2017-05-09 ENCOUNTER — Other Ambulatory Visit: Payer: Self-pay | Admitting: Family Medicine

## 2017-05-09 DIAGNOSIS — K219 Gastro-esophageal reflux disease without esophagitis: Secondary | ICD-10-CM

## 2017-06-07 ENCOUNTER — Ambulatory Visit (INDEPENDENT_AMBULATORY_CARE_PROVIDER_SITE_OTHER): Payer: BLUE CROSS/BLUE SHIELD

## 2017-06-07 DIAGNOSIS — Z23 Encounter for immunization: Secondary | ICD-10-CM | POA: Diagnosis not present

## 2017-08-07 ENCOUNTER — Other Ambulatory Visit: Payer: Self-pay | Admitting: Family Medicine

## 2017-08-07 DIAGNOSIS — E785 Hyperlipidemia, unspecified: Secondary | ICD-10-CM

## 2017-08-07 DIAGNOSIS — K219 Gastro-esophageal reflux disease without esophagitis: Secondary | ICD-10-CM

## 2017-08-28 ENCOUNTER — Other Ambulatory Visit: Payer: Self-pay | Admitting: Family Medicine

## 2017-08-28 DIAGNOSIS — E785 Hyperlipidemia, unspecified: Secondary | ICD-10-CM

## 2018-01-04 ENCOUNTER — Other Ambulatory Visit: Payer: Self-pay | Admitting: Family Medicine

## 2018-01-04 DIAGNOSIS — E785 Hyperlipidemia, unspecified: Secondary | ICD-10-CM

## 2018-01-11 ENCOUNTER — Ambulatory Visit (INDEPENDENT_AMBULATORY_CARE_PROVIDER_SITE_OTHER): Payer: BLUE CROSS/BLUE SHIELD | Admitting: Family Medicine

## 2018-01-11 ENCOUNTER — Encounter: Payer: Self-pay | Admitting: Family Medicine

## 2018-01-11 VITALS — BP 120/80 | HR 80 | Ht 63.0 in | Wt 211.0 lb

## 2018-01-11 DIAGNOSIS — Z1211 Encounter for screening for malignant neoplasm of colon: Secondary | ICD-10-CM | POA: Diagnosis not present

## 2018-01-11 DIAGNOSIS — R05 Cough: Secondary | ICD-10-CM

## 2018-01-11 DIAGNOSIS — K219 Gastro-esophageal reflux disease without esophagitis: Secondary | ICD-10-CM

## 2018-01-11 DIAGNOSIS — F329 Major depressive disorder, single episode, unspecified: Secondary | ICD-10-CM | POA: Diagnosis not present

## 2018-01-11 DIAGNOSIS — Z01419 Encounter for gynecological examination (general) (routine) without abnormal findings: Secondary | ICD-10-CM | POA: Diagnosis not present

## 2018-01-11 DIAGNOSIS — R059 Cough, unspecified: Secondary | ICD-10-CM

## 2018-01-11 DIAGNOSIS — Z1231 Encounter for screening mammogram for malignant neoplasm of breast: Secondary | ICD-10-CM | POA: Diagnosis not present

## 2018-01-11 DIAGNOSIS — Z0000 Encounter for general adult medical examination without abnormal findings: Secondary | ICD-10-CM

## 2018-01-11 DIAGNOSIS — E782 Mixed hyperlipidemia: Secondary | ICD-10-CM

## 2018-01-11 DIAGNOSIS — Z124 Encounter for screening for malignant neoplasm of cervix: Secondary | ICD-10-CM

## 2018-01-11 LAB — HEMOCCULT GUIAC POC 1CARD (OFFICE): Fecal Occult Blood, POC: NEGATIVE

## 2018-01-11 MED ORDER — SIMVASTATIN 40 MG PO TABS: ORAL_TABLET | ORAL | 1 refills | Status: DC

## 2018-01-11 MED ORDER — SERTRALINE HCL 50 MG PO TABS: 50.0000 mg | ORAL_TABLET | Freq: Every day | ORAL | 3 refills | Status: DC

## 2018-01-11 MED ORDER — BENZONATATE 200 MG PO CAPS: 200.0000 mg | ORAL_CAPSULE | Freq: Two times a day (BID) | ORAL | 0 refills | Status: DC | PRN

## 2018-01-11 MED ORDER — FAMOTIDINE 40 MG PO TABS: 40.0000 mg | ORAL_TABLET | Freq: Every day | ORAL | 1 refills | Status: DC

## 2018-01-11 NOTE — Progress Notes (Signed)
Name: ENES WEGENER   MRN: [id number]    DOB: Jun 07, 1961   Date:01/11/2018       Progress Note  Subjective  Chief Complaint  Chief Complaint  Patient presents with  . Annual Exam    pap and colonoscopy repeat 5 yrs-   . Gastroesophageal Reflux  . Hyperlipidemia  . Depression    Patient presents for annual physical exam with gyn/pap.Patient is a 57 year old femalr who presents for a comprehensive physical exam. The patient reports the following problems: depression s/p death in family. Health maintenance has been reviewed colonoscopy/mammogramto be scheduled.  Gastroesophageal Reflux  She complains of heartburn. She reports no abdominal pain, no belching, no chest pain, no choking, no coughing, no dysphagia, no early satiety, no globus sensation, no hoarse voice, no nausea, no sore throat, no water brash or no wheezing. This is a chronic problem. The current episode started more than 1 year ago. The problem occurs occasionally. The problem has been waxing and waning. The heartburn duration is several minutes. The heartburn is of mild intensity. The symptoms are aggravated by certain foods. Associated symptoms include fatigue. Pertinent negatives include no anemia, melena or weight loss. She has tried a histamine-2 antagonist for the symptoms. The treatment provided moderate relief. Past procedures do not include a UGI.  Hyperlipidemia  This is a chronic problem. The current episode started more than 1 year ago. The problem is controlled. Recent lipid tests were reviewed and are normal. She has no history of chronic renal disease or diabetes. Pertinent negatives include no chest pain, focal sensory loss, focal weakness, leg pain, myalgias or shortness of breath. Current antihyperlipidemic treatment includes statins. The current treatment provides moderate improvement of lipids. There are no compliance problems.   Depression         This is a new (life change circumstance) problem.  The onset  quality is gradual.   The problem occurs intermittently.  The problem has been waxing and waning since onset.  Associated symptoms include fatigue, insomnia, indigestion and sad.  Associated symptoms include no decreased concentration, no helplessness, no hopelessness, not irritable, no restlessness, no decreased interest, no myalgias, no headaches and no suicidal ideas.  Past treatments include nothing.  Compliance with treatment is good.   No problem-specific Assessment & Plan notes found for this encounter.   Past Medical History:  Diagnosis Date  . Depression   . GERD (gastroesophageal reflux disease)   . Hyperlipidemia     Past Surgical History:  Procedure Laterality Date  . CESAREAN SECTION     x 2  . COLONOSCOPY  2013   cleared for 10 yrs  . TONSILLECTOMY      Family History  Problem Relation Age of Onset  . Diabetes Mother   . Hypertension Mother     Social History   Socioeconomic History  . Marital status: Married    Spouse name: Not on file  . Number of children: Not on file  . Years of education: Not on file  . Highest education level: Not on file  Occupational History  . Not on file  Social Needs  . Financial resource strain: Not on file  . Food insecurity:    Worry: Not on file    Inability: Not on file  . Transportation needs:    Medical: Not on file    Non-medical: Not on file  Tobacco Use  . Smoking status: Former Games developer  . Smokeless tobacco: Never Used  Substance and Sexual  Activity  . Alcohol use: Yes    Alcohol/week: 0.0 oz  . Drug use: No  . Sexual activity: Yes  Lifestyle  . Physical activity:    Days per week: Not on file    Minutes per session: Not on file  . Stress: Not on file  Relationships  . Social connections:    Talks on phone: Not on file    Gets together: Not on file    Attends religious service: Not on file    Active member of club or organization: Not on file    Attends meetings of clubs or organizations: Not on file     Relationship status: Not on file  . Intimate partner violence:    Fear of current or ex partner: Not on file    Emotionally abused: Not on file    Physically abused: Not on file    Forced sexual activity: Not on file  Other Topics Concern  . Not on file  Social History Narrative  . Not on file    Allergies  Allergen Reactions  . Sulfa Antibiotics Hives    Outpatient Medications Prior to Visit  Medication Sig Dispense Refill  . famotidine (PEPCID) 40 MG tablet TAKE 1 TABLET BY MOUTH EVERY DAY 90 tablet 0  . simvastatin (ZOCOR) 40 MG tablet TAKE 1/2 TABLET (20 MG TOTAL) BY MOUTH DAILY. 45 tablet 0  . amoxicillin-clavulanate (AUGMENTIN) 875-125 MG tablet Take 1 tablet by mouth 2 (two) times daily. 20 tablet 0  . azithromycin (ZITHROMAX) 250 MG tablet Use as directed 6 tablet 0  . guaiFENesin-codeine (ROBITUSSIN AC) 100-10 MG/5ML syrup Take 5 mLs by mouth 3 (three) times daily as needed for cough. 150 mL 0  . simvastatin (ZOCOR) 40 MG tablet TAKE 1/2 TABLET (20 MG TOTAL) BY MOUTH DAILY. (NEED APPT AND FASTING LABS) 7 tablet 0   No facility-administered medications prior to visit.     Review of Systems  Constitutional: Positive for fatigue. Negative for chills, fever, malaise/fatigue and weight loss.  HENT: Negative for ear discharge, ear pain, hoarse voice and sore throat.   Eyes: Negative for blurred vision.  Respiratory: Negative for cough, sputum production, choking, shortness of breath and wheezing.   Cardiovascular: Negative for chest pain, palpitations and leg swelling.  Gastrointestinal: Positive for heartburn. Negative for abdominal pain, blood in stool, constipation, diarrhea, dysphagia, melena and nausea.  Genitourinary: Negative for dysuria, frequency, hematuria and urgency.  Musculoskeletal: Negative for back pain, joint pain, myalgias and neck pain.  Skin: Negative for rash.  Neurological: Negative for dizziness, tingling, sensory change, focal weakness and  headaches.  Endo/Heme/Allergies: Negative for environmental allergies and polydipsia. Does not bruise/bleed easily.  Psychiatric/Behavioral: Positive for depression. Negative for decreased concentration and suicidal ideas. The patient has insomnia. The patient is not nervous/anxious.      Objective  Vitals:   01/11/18 0834  BP: 120/80  Pulse: 80  Weight: 211 lb (95.7 kg)  Height:  (1.6 m)    Physical Exam  Constitutional: She is oriented to person, place, and time. Vital signs are normal. She appears well-developed and well-nourished. She is not irritable.  HENT:  Head: Normocephalic and atraumatic.  Right Ear: Hearing, tympanic membrane, external ear and ear canal normal.  Left Ear: Hearing, tympanic membrane, external ear and ear canal normal.  Nose: Nose normal.  Mouth/Throat: Uvula is midline and oropharynx is clear and moist. No oropharyngeal exudate, posterior oropharyngeal edema or posterior oropharyngeal erythema.  Eyes: Pupils are equal, round,  and reactive to light. Conjunctivae, EOM and lids are normal. Lids are everted and swept, no foreign bodies found. Left eye exhibits no hordeolum. No foreign body present in the left eye. Right conjunctiva is not injected. Left conjunctiva is not injected. No scleral icterus.  Fundoscopic exam:      The right eye shows no arteriolar narrowing and no AV nicking.       The left eye shows no arteriolar narrowing and no AV nicking.  Neck: Normal range of motion. Neck supple. Normal carotid pulses, no hepatojugular reflux and no JVD present. Carotid bruit is not present. No tracheal deviation present. No thyroid mass and no thyromegaly present.  Cardiovascular: Normal rate, regular rhythm, S1 normal, S2 normal, normal heart sounds and intact distal pulses. PMI is not displaced. Exam reveals no gallop, no S3, no S4 and no friction rub.  No murmur heard.  No systolic murmur is present.  No diastolic murmur is present. Pulmonary/Chest:  Effort normal and breath sounds normal. No respiratory distress. She has no decreased breath sounds. She has no wheezes. She has no rhonchi. She has no rales. Right breast exhibits no inverted nipple, no mass, no nipple discharge, no skin change and no tenderness. Left breast exhibits no inverted nipple, no mass, no nipple discharge, no skin change and no tenderness. No breast swelling, tenderness, discharge or bleeding. Breasts are symmetrical.  Abdominal: Soft. Normal appearance. She exhibits no mass. Bowel sounds are absent. There is no hepatosplenomegaly. There is no tenderness. There is no rebound and no guarding.  Genitourinary: Rectum normal, vagina normal and uterus normal. Pelvic exam was performed with patient supine. There is no lesion on the right labia. There is no lesion on the left labia. Cervix exhibits no motion tenderness, no discharge and no friability. Right adnexum displays no mass and no tenderness. Left adnexum displays no mass and no tenderness.  Musculoskeletal: Normal range of motion. She exhibits no edema or tenderness.  Lymphadenopathy:       Head (right side): No submandibular adenopathy present.       Head (left side): No submandibular adenopathy present.    She has no cervical adenopathy.    She has no axillary adenopathy.  Neurological: She is alert and oriented to person, place, and time. She has normal strength. She displays normal reflexes. No cranial nerve deficit or sensory deficit.  Reflex Scores:      Tricep reflexes are 2+ on the right side and 2+ on the left side.      Bicep reflexes are 2+ on the right side and 2+ on the left side.      Brachioradialis reflexes are 2+ on the right side and 2+ on the left side.      Patellar reflexes are 2+ on the right side and 2+ on the left side.      Achilles reflexes are 2+ on the right side and 2+ on the left side. Skin: Skin is warm. No rash noted. She is not diaphoretic. No pallor.  Psychiatric: She has a normal mood  and affect. Her mood appears not anxious. She does not exhibit a depressed mood.  Nursing note and vitals reviewed.     Assessment & Plan  Problem List Items Addressed This Visit    None      No orders of the defined types were placed in this encounter.  KATHREEN DILEO is a 57 y.o. female who presents today for her Complete Annual Exam. She feels well. She  reports exercising . She reports she is sleeping well.  Immunizations are reviewed and recommendations provided.   Age appropriate screening tests are discussed. Counseling given for risk factor reduction interventions.Health risks of being over weight were discussed and patient was counseled on weight loss options and exercise. Dr. Hayden [facility]  01/11/18

## 2018-01-12 ENCOUNTER — Telehealth: Payer: Self-pay

## 2018-01-12 LAB — LIPID PANEL
CHOL/HDL RATIO: 4.3 ratio (ref 0.0–4.4)
Cholesterol, Total: 203 mg/dL — ABNORMAL HIGH (ref 100–199)
HDL: 47 mg/dL (ref >39)
LDL Calculated: 107 mg/dL — ABNORMAL HIGH (ref 0–99)
TRIGLYCERIDES: 243 mg/dL — AB (ref 0–149)
VLDL Cholesterol Cal: 49 mg/dL — ABNORMAL HIGH (ref 5–40)

## 2018-01-12 LAB — RENAL FUNCTION PANEL
ALBUMIN: 4.5 g/dL (ref 3.5–5.5)
BUN/Creatinine Ratio: 16 (ref 9–23)
BUN: 13 mg/dL (ref 6–24)
CO2: 24 mmol/L (ref 20–29)
CREATININE: 0.8 mg/dL (ref 0.57–1.00)
Calcium: 9.6 mg/dL (ref 8.7–10.2)
Chloride: 103 mmol/L (ref 96–106)
GFR calc non Af Amer: 83 mL/min/{1.73_m2} (ref >59)
GFR, EST AFRICAN AMERICAN: 95 mL/min/{1.73_m2} (ref >59)
GLUCOSE: 103 mg/dL — AB (ref 65–99)
PHOSPHORUS: 3.7 mg/dL (ref 2.5–4.5)
POTASSIUM: 4.4 mmol/L (ref 3.5–5.2)
Sodium: 141 mmol/L (ref 134–144)

## 2018-01-12 NOTE — Telephone Encounter (Signed)
Pt called in wanting cough syrup called in because tessalon perles aren't helping- sent in Rob AC 4 ounces to [facility]

## 2018-01-14 LAB — PAP IG (IMAGE GUIDED): PAP SMEAR COMMENT: 0

## 2018-01-14 LAB — SPECIMEN STATUS REPORT

## 2018-01-23 ENCOUNTER — Other Ambulatory Visit: Payer: Self-pay | Admitting: Family Medicine

## 2018-01-23 ENCOUNTER — Ambulatory Visit
Admission: RE | Admit: 2018-01-23 | Discharge: 2018-01-23 | Disposition: A | Discharge status: Home / Self Care | Payer: BLUE CROSS/BLUE SHIELD | Source: Ambulatory Visit | Attending: Family Medicine | Admitting: Family Medicine

## 2018-01-23 DIAGNOSIS — Z1231 Encounter for screening mammogram for malignant neoplasm of breast: Secondary | ICD-10-CM

## 2018-02-02 ENCOUNTER — Other Ambulatory Visit: Payer: Self-pay | Admitting: Family Medicine

## 2018-02-02 DIAGNOSIS — F329 Major depressive disorder, single episode, unspecified: Secondary | ICD-10-CM

## 2018-03-08 ENCOUNTER — Encounter: Payer: Self-pay | Admitting: Family Medicine

## 2018-03-08 ENCOUNTER — Ambulatory Visit: Payer: BLUE CROSS/BLUE SHIELD | Admitting: Family Medicine

## 2018-03-08 VITALS — BP 120/78 | HR 68 | Ht 63.0 in | Wt 207.0 lb

## 2018-03-08 DIAGNOSIS — R5383 Other fatigue: Secondary | ICD-10-CM

## 2018-03-08 NOTE — Progress Notes (Signed)
Name: Brittany Patton   MRN: [id number]    DOB: 1961-02-28   Date:03/08/2018       Progress Note  Subjective  Chief Complaint  Chief Complaint  Patient presents with  . Depression    didn't like the way the sertraline made her feel- she was "sleepy"    Depression         This is a new problem.  The current episode started more than 1 month ago.   The onset quality is gradual.   The problem occurs intermittently.  The problem has been gradually improving since onset.  Associated symptoms include fatigue.  Associated symptoms include no decreased concentration, no helplessness, no hopelessness, does not have insomnia, not irritable, no restlessness, no decreased interest, no appetite change, no body aches, no myalgias, no headaches, no indigestion, not sad and no suicidal ideas.  Past treatments include nothing.  Past compliance problems include medication issues.  Previous treatment provided mild relief.   No problem-specific Assessment & Plan notes found for this encounter.   Past Medical History:  Diagnosis Date  . Depression   . GERD (gastroesophageal reflux disease)   . Hyperlipidemia     Past Surgical History:  Procedure Laterality Date  . CESAREAN SECTION     x 2  . COLONOSCOPY  2013   cleared for 10 yrs  . TONSILLECTOMY      Family History  Problem Relation Age of Onset  . Diabetes Mother   . Hypertension Mother   . Breast cancer Maternal Aunt 77    Social History   Socioeconomic History  . Marital status: Married    Spouse name: Not on file  . Number of children: Not on file  . Years of education: Not on file  . Highest education level: Not on file  Occupational History  . Not on file  Social Needs  . Financial resource strain: Not on file  . Food insecurity:    Worry: Not on file    Inability: Not on file  . Transportation needs:    Medical: Not on file    Non-medical: Not on file  Tobacco Use  . Smoking status: Former Games developer  . Smokeless tobacco:  Never Used  Substance and Sexual Activity  . Alcohol use: Yes    Alcohol/week: 0.0 oz  . Drug use: No  . Sexual activity: Yes  Lifestyle  . Physical activity:    Days per week: Not on file    Minutes per session: Not on file  . Stress: Not on file  Relationships  . Social connections:    Talks on phone: Not on file    Gets together: Not on file    Attends religious service: Not on file    Active member of club or organization: Not on file    Attends meetings of clubs or organizations: Not on file    Relationship status: Not on file  . Intimate partner violence:    Fear of current or ex partner: Not on file    Emotionally abused: Not on file    Physically abused: Not on file    Forced sexual activity: Not on file  Other Topics Concern  . Not on file  Social History Narrative  . Not on file    Allergies  Allergen Reactions  . Sulfa Antibiotics Hives    Outpatient Medications Prior to Visit  Medication Sig Dispense Refill  . famotidine (PEPCID) 40 MG tablet Take 1 tablet (40 mg total)  by mouth daily. 90 tablet 1  . simvastatin (ZOCOR) 40 MG tablet TAKE 1/2 TABLET (20 MG TOTAL) BY MOUTH DAILY. 45 tablet 1  . benzonatate (TESSALON) 200 MG capsule Take 1 capsule (200 mg total) by mouth 2 (two) times daily as needed for cough. 20 capsule 0  . sertraline (ZOLOFT) 50 MG tablet TAKE 1 TABLET (50 MG TOTAL) BY MOUTH DAILY. START ONE HALF A DAY FOR 10 DAYS THEN 1 A DAY 90 tablet 0   No facility-administered medications prior to visit.     Review of Systems  Constitutional: Positive for fatigue. Negative for appetite change, chills, fever, malaise/fatigue and weight loss.  HENT: Negative for ear discharge, ear pain and sore throat.   Eyes: Negative for blurred vision.  Respiratory: Negative for cough, sputum production, shortness of breath and wheezing.   Cardiovascular: Negative for chest pain, palpitations and leg swelling.  Gastrointestinal: Negative for abdominal pain, blood  in stool, constipation, diarrhea, heartburn, melena and nausea.  Genitourinary: Negative for dysuria, frequency, hematuria and urgency.  Musculoskeletal: Negative for back pain, joint pain, myalgias and neck pain.  Skin: Negative for rash.  Neurological: Negative for dizziness, tingling, sensory change, focal weakness and headaches.  Endo/Heme/Allergies: Negative for environmental allergies and polydipsia. Does not bruise/bleed easily.  Psychiatric/Behavioral: Positive for depression. Negative for decreased concentration and suicidal ideas. The patient is not nervous/anxious and does not have insomnia.      Objective  Vitals:   03/08/18 0912  BP: 120/78  Pulse: 68  Weight: 207 lb (93.9 kg)  Height: 5\' 3"  (1.6 m)    Physical Exam  Constitutional: She is oriented to person, place, and time. She appears well-developed and well-nourished. She is not irritable.  HENT:  Head: Normocephalic.  Right Ear: External ear normal.  Left Ear: External ear normal.  Mouth/Throat: Oropharynx is clear and moist.  Eyes: Pupils are equal, round, and reactive to light. Conjunctivae and EOM are normal. Lids are everted and swept, no foreign bodies found. Left eye exhibits no hordeolum. No foreign body present in the left eye. Right conjunctiva is not injected. Left conjunctiva is not injected. No scleral icterus.  Neck: Normal range of motion. Neck supple. No JVD present. No tracheal deviation present. No thyromegaly present.  Cardiovascular: Normal rate, regular rhythm, normal heart sounds and intact distal pulses. Exam reveals no gallop and no friction rub.  No murmur heard. Pulmonary/Chest: Effort normal and breath sounds normal. No respiratory distress. She has no wheezes. She has no rales.  Abdominal: Soft. Bowel sounds are normal. She exhibits no mass. There is no hepatosplenomegaly. There is no tenderness. There is no rebound and no guarding.  Musculoskeletal: Normal range of motion. She exhibits no  edema or tenderness.  Lymphadenopathy:    She has no cervical adenopathy.  Neurological: She is alert and oriented to person, place, and time. She has normal strength. She displays normal reflexes. No cranial nerve deficit.  Skin: Skin is warm. No rash noted.  Psychiatric: She has a normal mood and affect. Her mood appears not anxious. She does not exhibit a depressed mood.  Nursing note and vitals reviewed.     Assessment & Plan  Problem List Items Addressed This Visit    None    Visit Diagnoses    Fatigue, unspecified type    -  Primary   pt stopped sertraline, depression is improving. Will draw thyroid and Hgb level   Relevant Orders   TSH   Hemoglobin  No orders of the defined types were placed in this encounter.     Dr. Hayden Rasmusseneanna Jones [facility]  03/08/18

## 2018-03-09 LAB — TSH: TSH: 1.14 u[IU]/mL (ref 0.450–4.500)

## 2018-03-09 LAB — HEMOGLOBIN: HEMOGLOBIN: 13.8 g/dL (ref 11.1–15.9)

## 2018-07-06 ENCOUNTER — Other Ambulatory Visit: Payer: Self-pay

## 2018-07-06 ENCOUNTER — Ambulatory Visit (INDEPENDENT_AMBULATORY_CARE_PROVIDER_SITE_OTHER): Payer: BLUE CROSS/BLUE SHIELD

## 2018-07-06 ENCOUNTER — Encounter: Payer: Self-pay | Admitting: Emergency Medicine

## 2018-07-06 ENCOUNTER — Ambulatory Visit
Admission: EM | Admit: 2018-07-06 | Discharge: 2018-07-06 | Disposition: A | Discharge status: Home / Self Care | Payer: BLUE CROSS/BLUE SHIELD | Attending: Family Medicine | Admitting: Family Medicine

## 2018-07-06 DIAGNOSIS — R109 Unspecified abdominal pain: Secondary | ICD-10-CM

## 2018-07-06 DIAGNOSIS — R1030 Lower abdominal pain, unspecified: Secondary | ICD-10-CM | POA: Diagnosis not present

## 2018-07-06 LAB — URINALYSIS, COMPLETE (UACMP) WITH MICROSCOPIC
BACTERIA UA: NONE SEEN
Bilirubin Urine: NEGATIVE
GLUCOSE, UA: NEGATIVE mg/dL
Hgb urine dipstick: NEGATIVE
Ketones, ur: NEGATIVE mg/dL
Leukocytes, UA: NEGATIVE
Nitrite: NEGATIVE
Protein, ur: NEGATIVE mg/dL
RBC / HPF: NONE SEEN RBC/hpf (ref 0–5)
Specific Gravity, Urine: <1.005 — ABNORMAL LOW (ref 1.005–1.030)
WBC UA: NONE SEEN WBC/hpf (ref 0–5)
pH: 5 (ref 5.0–8.0)

## 2018-07-06 MED ORDER — POLYETHYLENE GLYCOL 3350 17 GM/SCOOP PO POWD: 17.0000 g | Freq: Two times a day (BID) | ORAL | 0 refills | Status: DC

## 2018-07-06 MED ORDER — AMOXICILLIN-POT CLAVULANATE 875-125 MG PO TABS: 1.0000 | ORAL_TABLET | Freq: Two times a day (BID) | ORAL | 0 refills | Status: DC

## 2018-07-06 NOTE — Discharge Instructions (Signed)
Miralax as prescribed.  If no improvement, please let me know.  Dr. Adriana Simasook

## 2018-07-06 NOTE — ED Provider Notes (Signed)
[facility]    CSN: [id number] Arrival date & time: 07/06/18  1850  History   Chief Complaint Chief Complaint  Patient presents with  . Abdominal Pain   HPI   57 year old female presents with lower abdominal pain.  2-day history of lower abdominal pain.  Located primarily in the left lower quadrant and suprapubic regions.  Mild pain.  No associated diarrhea, constipation, urinary symptoms.  Patient states she has had some gas.  Patient states that it feels similar to her menstrual cycle although she is not having menses.  No vaginal bleeding.  No fever.  No chills.  Able to eat and drink.  She has taken Gas-X without relief.  No other associated symptoms.  No other complaints.   PMH, Surgical Hx, Family Hx, Social History reviewed and updated as below.  Past Medical History:  Diagnosis Date  . Depression   . GERD (gastroesophageal reflux disease)   . Hyperlipidemia    Patient Active Problem List   Diagnosis Date Noted  . Hyperlipidemia 04/13/2016  . Esophageal reflux 04/13/2016    Past Surgical History:  Procedure Laterality Date  . CESAREAN SECTION     x 2  . COLONOSCOPY  2013   cleared for 10 yrs  . TONSILLECTOMY      OB History   None    Home Medications    Prior to Admission medications   Medication Sig Start Date End Date Taking? Authorizing Provider  famotidine (PEPCID) 40 MG tablet Take 1 tablet (40 mg total) by mouth daily. 01/11/18  Yes Duanne Limerick, MD  simvastatin (ZOCOR) 40 MG tablet TAKE 1/2 TABLET (20 MG TOTAL) BY MOUTH DAILY. 01/11/18  Yes Duanne Limerick, MD  amoxicillin-clavulanate (AUGMENTIN) 875-125 MG tablet Take 1 tablet by mouth every 12 (twelve) hours. 07/06/18   Tommie Sams, DO  polyethylene glycol powder (GLYCOLAX/MIRALAX) powder Take 17 g by mouth 2 (two) times daily. 07/06/18   Tommie Sams, DO    Family History Family History  Problem Relation Age of Onset  . Diabetes Mother   . Breast cancer Maternal Aunt 40  .  Hypertension Father   . Heart disease Father     Social History Social History   Tobacco Use  . Smoking status: Former Smoker    Last attempt to quit: 07/06/1996    Years since quitting: 22.0  . Smokeless tobacco: Never Used  Substance Use Topics  . Alcohol use: Yes    Alcohol/week: 0.0 standard drinks    Comment: rarely  . Drug use: No     Allergies   Sulfa antibiotics  Review of Systems Review of Systems  Constitutional: Negative.   Gastrointestinal: Positive for abdominal pain.   Physical Exam Triage Vital Signs ED Triage Vitals  Enc Vitals Group     BP 07/06/18 1901 140/78     Pulse Rate 07/06/18 1901 88     Resp 07/06/18 1901 16     Temp 07/06/18 1901 98.7 F (37.1 C)     Temp Source 07/06/18 1901 Oral     SpO2 07/06/18 1901 97 %     Weight 07/06/18 1902 207 lb (93.9 kg)     Height 07/06/18 1902 5\' 3"  (1.6 m)     Head Circumference --      Peak Flow --      Pain Score 07/06/18 1901 2     Pain Loc --      Pain Edu? --  Excl. in GC? --    Updated Vital Signs BP 140/78 (BP Location: Right Arm)   Pulse 88   Temp 98.7 F (37.1 C) (Oral)   Resp 16   Ht 5\' 3"  (1.6 m)   Wt 93.9 kg   SpO2 97%   BMI 36.67 kg/m   Visual Acuity Right Eye Distance:   Left Eye Distance:   Bilateral Distance:    Right Eye Near:   Left Eye Near:    Bilateral Near:     Physical Exam  Constitutional: She is oriented to person, place, and time. She appears well-developed. No distress.  Cardiovascular: Normal rate and regular rhythm.  Pulmonary/Chest: Effort normal and breath sounds normal. She has no wheezes. She has no rales.  Abdominal: Soft. She exhibits no distension.  Left lower quadrant tenderness.  Suprapubic tenderness as well.  Nondistended.  No rebound or guarding.  Neurological: She is alert and oriented to person, place, and time.  Psychiatric: She has a normal mood and affect. Her behavior is normal.  Nursing note and vitals reviewed.  UC Treatments /  Results  Labs (all labs ordered are listed, but only abnormal results are displayed) Labs Reviewed  URINALYSIS, COMPLETE (UACMP) WITH MICROSCOPIC - Abnormal; Notable for the following components:      Result Value   Color, Urine STRAW (*)    Specific Gravity, Urine <1.005 (*)    All other components within normal limits    EKG None  Radiology Dg Abd 1 View  Result Date: 07/06/2018 CLINICAL DATA:  Abdominal pain for 2 days, initial encounter EXAM: ABDOMEN - 1 VIEW COMPARISON:  None. FINDINGS: Scattered large and small bowel gas is noted. Fecal material is noted within the colon consistent with a mild degree of constipation. No obstructive changes are seen. Mild degenerative changes of the lumbar spine are noted. No free air is seen. No mass lesion is noted. IMPRESSION: Mild constipation. No acute abnormality noted. Electronically Signed   By: Alcide CleverMark  Lukens M.D.   On: 07/06/2018 20:11    Procedures Procedures (including critical care time)  Medications Ordered in UC Medications - No data to display  Initial Impression / Assessment and Plan / UC Course  I have reviewed the triage vital signs and the nursing notes.  Pertinent labs & imaging results that were available during my care of the patient were reviewed by me and considered in my medical decision making (see chart for details).    57 year old female presents with lower abdominal pain.  Suspect constipation.  KUB revealed mild constipation.  Treating with MiraLAX.  Possibility of diverticulitis. Wait and see prescription for augmentin.  Final Clinical Impressions(s) / UC Diagnoses   Final diagnoses:  Abdominal pain  Lower abdominal pain     Discharge Instructions     Miralax as prescribed.  If no improvement, please let me know.  Dr. Adriana Simasook     ED Prescriptions    Medication Sig Dispense Auth. Provider   polyethylene glycol powder (GLYCOLAX/MIRALAX) powder Take 17 g by mouth 2 (two) times daily. 500 g Ketzaly Cardella,  Abeera Flannery G, DO   amoxicillin-clavulanate (AUGMENTIN) 875-125 MG tablet Take 1 tablet by mouth every 12 (twelve) hours. 14 tablet Tommie Samsook, Aneka Fagerstrom G, DO     Controlled Substance Prescriptions [place] Controlled Substance Registry consulted? Not Applicable   Tommie SamsCook, Gabby Rackers G, DO 07/06/18 2022

## 2018-07-06 NOTE — ED Triage Notes (Signed)
Patient in today c/o lower abdominal pain x 2 days. Patient denies fever. Patient denies diarrhea, constipation or urinary frequency or dysuria. Patient states she has had a lot of gas.

## 2018-07-10 ENCOUNTER — Ambulatory Visit: Payer: Self-pay | Admitting: Family Medicine

## 2018-10-11 ENCOUNTER — Other Ambulatory Visit: Payer: Self-pay | Admitting: Family Medicine

## 2018-10-11 DIAGNOSIS — E782 Mixed hyperlipidemia: Secondary | ICD-10-CM

## 2018-10-18 ENCOUNTER — Ambulatory Visit: Payer: Self-pay | Admitting: Family Medicine

## 2018-10-25 ENCOUNTER — Other Ambulatory Visit: Payer: Self-pay | Admitting: Family Medicine

## 2018-10-25 DIAGNOSIS — E782 Mixed hyperlipidemia: Secondary | ICD-10-CM

## 2019-03-13 ENCOUNTER — Other Ambulatory Visit: Payer: Self-pay | Admitting: Family Medicine

## 2019-03-13 DIAGNOSIS — K219 Gastro-esophageal reflux disease without esophagitis: Secondary | ICD-10-CM

## 2019-07-05 ENCOUNTER — Other Ambulatory Visit: Payer: Self-pay

## 2019-07-05 DIAGNOSIS — N898 Other specified noninflammatory disorders of vagina: Secondary | ICD-10-CM

## 2019-07-05 NOTE — Progress Notes (Signed)
Put ref in for [facility] Nov 17th @ 1:30 Dr Glennon Mac in [facility]

## 2019-07-10 ENCOUNTER — Encounter: Payer: Self-pay | Admitting: Obstetrics and Gynecology

## 2019-07-10 ENCOUNTER — Other Ambulatory Visit: Payer: Self-pay

## 2019-07-10 ENCOUNTER — Ambulatory Visit (INDEPENDENT_AMBULATORY_CARE_PROVIDER_SITE_OTHER): Payer: Self-pay | Admitting: Obstetrics and Gynecology

## 2019-07-10 VITALS — BP 150/84 | Ht 63.0 in | Wt 218.0 lb

## 2019-07-10 DIAGNOSIS — N9089 Other specified noninflammatory disorders of vulva and perineum: Secondary | ICD-10-CM

## 2019-07-10 NOTE — Progress Notes (Signed)
Obstetrics & Gynecology Office Visit   Chief Complaint  Patient presents with  . Vaginal exam    external spot on right side, itchy at first not anymore, hard at touch   History of Present Illness: 58 y.o. G60P2002 female who presents with an issue on her vaginal area. The area is located on her right side.  Initially, she had itching.  She felt in the area and the area felt something like a bump.  She no longer has itching. She does have irritation when her clothing rubs against it. She noticed it early last week.  She has had not had bleeding from the area.  Her last period was at least three years.  She does not remember any trauma to the area.  She is not sexually active since her husband died in 07/01/16.  Last pap smear was last year and it was normal.  She has not tried putting anything on the area. Nothing else makes it worse.  Nothing makes it feel better.  No associated symptoms.    Past Medical History:  Diagnosis Date  . Depression   . GERD (gastroesophageal reflux disease)   . Hyperlipidemia     Past Surgical History:  Procedure Laterality Date  . CESAREAN SECTION     x 2  . CHOLECYSTECTOMY, LAPAROSCOPIC  2015  . COLONOSCOPY  2013   cleared for 10 yrs  . TONSILLECTOMY     Gynecologic History: No LMP recorded. Patient is postmenopausal.  Obstetric History: [id number]  Family History  Problem Relation Age of Onset  . Diabetes Mother   . Breast cancer Maternal Aunt 70  . Hypertension Father   . Heart disease Father   . Glaucoma Father     Social History   Socioeconomic History  . Marital status: Widowed    Spouse name: Not on file  . Number of children: Not on file  . Years of education: Not on file  . Highest education level: Not on file  Occupational History  . Not on file  Social Needs  . Financial resource strain: Not on file  . Food insecurity    Worry: Not on file    Inability: Not on file  . Transportation needs    Medical: Not on file   Non-medical: Not on file  Tobacco Use  . Smoking status: Former Smoker    Quit date: 07/06/1996    Years since quitting: 23.0  . Smokeless tobacco: Never Used  Substance and Sexual Activity  . Alcohol use: Yes    Alcohol/week: 0.0 standard drinks    Comment: rarely  . Drug use: No  . Sexual activity: Not Currently    Birth control/protection: Post-menopausal  Lifestyle  . Physical activity    Days per week: Not on file    Minutes per session: Not on file  . Stress: Not on file  Relationships  . Social Herbalist on phone: Not on file    Gets together: Not on file    Attends religious service: Not on file    Active member of club or organization: Not on file    Attends meetings of clubs or organizations: Not on file    Relationship status: Not on file  . Intimate partner violence    Fear of current or ex partner: Not on file    Emotionally abused: Not on file    Physically abused: Not on file    Forced sexual activity: Not on file  Other  Topics Concern  . Not on file  Social History Narrative  . Not on file    Allergies  Allergen Reactions  . Sulfa Antibiotics Hives    Prior to Admission medications   Medication Sig Start Date End Date Taking? Authorizing Provider  famotidine (PEPCID) 40 MG tablet TAKE 1 TABLET BY MOUTH EVERY DAY 03/13/19  Yes Duanne Limerick, MD  simvastatin (ZOCOR) 20 MG tablet TAKE 1 TABLET BY MOUTH EVERY DAY Patient not taking: Reported on 07/10/2019 10/25/18   Duanne Limerick, MD    Review of Systems  Constitutional: Negative.   HENT: Negative.   Eyes: Negative.   Respiratory: Negative.   Cardiovascular: Negative.   Gastrointestinal: Negative.   Genitourinary: Negative.        See HPI  Musculoskeletal: Negative.   Skin: Positive for itching and rash.       Multiple plaques and papules all over body  Neurological: Negative.   Psychiatric/Behavioral: Negative.      Physical Exam BP (!) 150/84   Ht 5\' 3"  (1.6 m)   Wt 218 lb  (98.9 kg)   BMI 38.62 kg/m  No LMP recorded. Patient is postmenopausal. Physical Exam Constitutional:      General: She is not in acute distress.    Appearance: Normal appearance.  Genitourinary:     Pelvic exam was performed with patient in the lithotomy position.     Inguinal canal, urethra, bladder, vagina and cervix normal.        Genitourinary Comments: No bimanual performed.  Rectum:     External hemorrhoid present.  HENT:     Head: Normocephalic and atraumatic.  Eyes:     General: No scleral icterus.    Conjunctiva/sclera: Conjunctivae normal.  Neck:     Musculoskeletal: Normal range of motion and neck supple.  Cardiovascular:     Rate and Rhythm: Normal rate and regular rhythm.     Heart sounds: No murmur. No friction rub. No gallop.   Pulmonary:     Effort: Pulmonary effort is normal.     Breath sounds: Normal breath sounds. No wheezing, rhonchi or rales.  Abdominal:     General: There is no distension.     Palpations: Abdomen is soft. There is no mass.     Tenderness: There is no abdominal tenderness. There is no guarding or rebound.     Hernia: No hernia is present.  Musculoskeletal: Normal range of motion.        General: No swelling.  Neurological:     General: No focal deficit present.     Mental Status: She is alert and oriented to person, place, and time.     Cranial Nerves: No cranial nerve deficit.  Skin:    General: Skin is warm and dry.     Findings: Rash present.     Comments: Diffuse, scattered erythematous plaques and papules with scaling.  (patient states she has a history of psoriasis. She used to receive injections which made them better, but no longer takes them due to insurance).  Psychiatric:        Mood and Affect: Mood normal.        Behavior: Behavior normal.        Judgment: Judgment normal.     Female chaperone present for pelvic and breast  portions of the physical exam  Assessment: 58 y.o. G34P2002 female here for  1. Vulvar  lesion      Plan: Problem List Items Addressed This Visit  None    Visit Diagnoses    Vulvar lesion    -  Primary     The differential includes neoplasia, irritation, folliculitis (which seems less likely given lack of erythema).  Discussed options for investigation and treatment.  Option 1 would be to treat with a topical emollient and cover for 1 to 2 weeks and return to clinic for repeat examination.  Option 2 would be to perform a biopsy of the area.  The patient seemed disinclined to have a biopsy today.  I think it would be reasonable to give a little time to have this area heal as it appears to be less symptomatic then originally.  I encouraged her strongly to follow-up due to the fact that I do not know what this type of lesion is exactly.  There is no definite indicator that it is neoplastic.  However, since I am not sure, I would recommend biopsy if it does not improve with conservative measures.  25 minutes spent in face to face discussion with > 50% spent in counseling,management, and coordination of care of her vulvar lesion.   Thomasene MohairStephen Ridley Dileo, MD 07/10/2019 1:51 PM

## 2019-07-26 ENCOUNTER — Telehealth: Payer: Self-pay

## 2019-07-26 ENCOUNTER — Telehealth: Payer: Self-pay | Admitting: Obstetrics and Gynecology

## 2019-07-26 NOTE — Telephone Encounter (Signed)
Patient calling about a bill she received. Message sent to [facility] for review

## 2019-07-26 NOTE — Telephone Encounter (Signed)
Pt called triage stating she has a apt tomorrow with SDJ. Wasn't sure if she needed to come because the spot that SDJ was looking at has gotten better. I advised her since its not completely gone I would come to the appointment and see whar sdj thinks.

## 2019-07-27 ENCOUNTER — Encounter: Payer: Self-pay | Admitting: Obstetrics and Gynecology

## 2019-07-27 ENCOUNTER — Ambulatory Visit: Payer: Medicaid Other | Admitting: Obstetrics and Gynecology

## 2019-07-27 ENCOUNTER — Ambulatory Visit (INDEPENDENT_AMBULATORY_CARE_PROVIDER_SITE_OTHER): Payer: Self-pay | Admitting: Obstetrics and Gynecology

## 2019-07-27 ENCOUNTER — Other Ambulatory Visit: Payer: Self-pay

## 2019-07-27 VITALS — BP 122/74 | Ht 63.0 in | Wt 220.0 lb

## 2019-07-27 DIAGNOSIS — K649 Unspecified hemorrhoids: Secondary | ICD-10-CM

## 2019-07-27 DIAGNOSIS — N9089 Other specified noninflammatory disorders of vulva and perineum: Secondary | ICD-10-CM

## 2019-07-27 NOTE — Progress Notes (Signed)
Obstetrics & Gynecology Office Visit   Chief Complaint  Patient presents with  . Follow-up   History of Present Illness: 58 y.o. G46P2002 female who presents in follow up from a vulvar lesion on her right side. She was last seen on 11/17. She was offered a biopsy as I was unsure of what the lesion was.  She declined in favor of conservative treatment.  She believes her lesion has improved.     Past Medical History:  Diagnosis Date  . Depression   . GERD (gastroesophageal reflux disease)   . Hyperlipidemia     Past Surgical History:  Procedure Laterality Date  . CESAREAN SECTION     x 2  . CHOLECYSTECTOMY, LAPAROSCOPIC  2015  . COLONOSCOPY  2013   cleared for 10 yrs  . TONSILLECTOMY      Gynecologic History: No LMP recorded. Patient is postmenopausal.  Obstetric History: [id number]  Family History  Problem Relation Age of Onset  . Diabetes Mother   . Breast cancer Maternal Aunt 40  . Hypertension Father   . Heart disease Father   . Glaucoma Father     Social History   Socioeconomic History  . Marital status: Widowed    Spouse name: Not on file  . Number of children: Not on file  . Years of education: Not on file  . Highest education level: Not on file  Occupational History  . Not on file  Social Needs  . Financial resource strain: Not on file  . Food insecurity    Worry: Not on file    Inability: Not on file  . Transportation needs    Medical: Not on file    Non-medical: Not on file  Tobacco Use  . Smoking status: Former Smoker    Quit date: 07/06/1996    Years since quitting: 23.0  . Smokeless tobacco: Never Used  Substance and Sexual Activity  . Alcohol use: Yes    Alcohol/week: 0.0 standard drinks    Comment: rarely  . Drug use: No  . Sexual activity: Not Currently    Birth control/protection: Post-menopausal  Lifestyle  . Physical activity    Days per week: Not on file    Minutes per session: Not on file  . Stress: Not on file  Relationships   . Social Musician on phone: Not on file    Gets together: Not on file    Attends religious service: Not on file    Active member of club or organization: Not on file    Attends meetings of clubs or organizations: Not on file    Relationship status: Not on file  . Intimate partner violence    Fear of current or ex partner: Not on file    Emotionally abused: Not on file    Physically abused: Not on file    Forced sexual activity: Not on file  Other Topics Concern  . Not on file  Social History Narrative  . Not on file    Allergies  Allergen Reactions  . Sulfa Antibiotics Hives    Prior to Admission medications   Medication Sig Start Date End Date Taking? Authorizing Provider  famotidine (PEPCID) 40 MG tablet TAKE 1 TABLET BY MOUTH EVERY DAY 03/13/19   Duanne Limerick, MD  simvastatin (ZOCOR) 20 MG tablet TAKE 1 TABLET BY MOUTH EVERY DAY Patient not taking: Reported on 07/10/2019 10/25/18   Duanne Limerick, MD    Review of Systems  Constitutional:  Negative.   HENT: Negative.   Eyes: Negative.   Respiratory: Negative.   Cardiovascular: Negative.   Gastrointestinal: Negative.   Genitourinary: Negative.   Musculoskeletal: Negative.   Skin: Negative.   Neurological: Negative.   Psychiatric/Behavioral: Negative.      Physical Exam BP 122/74   Ht 5\' 3"  (1.6 m)   Wt 220 lb (99.8 kg)   BMI 38.97 kg/m  No LMP recorded. Patient is postmenopausal. Physical Exam Constitutional:      General: She is not in acute distress.    Appearance: Normal appearance.  Genitourinary:     Pelvic exam was performed with patient in the lithotomy position.     HENT:     Head: Normocephalic and atraumatic.  Eyes:     General: No scleral icterus.    Conjunctiva/sclera: Conjunctivae normal.  Neurological:     General: No focal deficit present.     Mental Status: She is alert and oriented to person, place, and time.     Cranial Nerves: No cranial nerve deficit.  Psychiatric:         Mood and Affect: Mood normal.        Behavior: Behavior normal.        Judgment: Judgment normal.     Female chaperone present for pelvic and breast  portions of the physical exam  Assessment: 58 y.o. G61P2002 female here for  1. Vulvar lesion   2. Hemorrhoids, unspecified hemorrhoid type      Plan: Problem List Items Addressed This Visit    None    Visit Diagnoses    Vulvar lesion    -  Primary   Hemorrhoids, unspecified hemorrhoid type         No biopsy performed today as the area is almost back to normal. She was encouraged to follow up if the area worsens.   I also encouraged her to follow up with her PCP or GI about her hemorrhoids. She states they have been present for 23 years. She had a colonoscopy 5 years ago and the hemorrhoids were present and she was told they were normal. They appear a little abnormal to me for hemorrhoids. However, I did tell the patient that I recommended she have her PCP or GI inspect the area for a better assessment. SHe voiced understanding and agreement.   15 minutes spent in face to face discussion with > 50% spent in counseling,management, and coordination of care of her vulvar lesion (now nearly resolved), and hemorrhoids (unspecified).   Prentice Docker, MD 07/27/2019 12:18 PM

## 2019-08-10 ENCOUNTER — Ambulatory Visit: Payer: Medicaid Other | Admitting: Obstetrics and Gynecology

## 2020-01-31 ENCOUNTER — Other Ambulatory Visit: Payer: Self-pay

## 2020-02-21 ENCOUNTER — Other Ambulatory Visit: Payer: Self-pay

## 2020-02-21 ENCOUNTER — Encounter: Payer: Self-pay | Admitting: Family Medicine

## 2020-02-21 ENCOUNTER — Ambulatory Visit (INDEPENDENT_AMBULATORY_CARE_PROVIDER_SITE_OTHER): Payer: Self-pay | Admitting: Family Medicine

## 2020-02-21 VITALS — BP 120/80 | HR 80 | Ht 63.0 in | Wt 221.0 lb

## 2020-02-21 DIAGNOSIS — Z1211 Encounter for screening for malignant neoplasm of colon: Secondary | ICD-10-CM

## 2020-02-21 DIAGNOSIS — E782 Mixed hyperlipidemia: Secondary | ICD-10-CM

## 2020-02-21 DIAGNOSIS — Z021 Encounter for pre-employment examination: Secondary | ICD-10-CM

## 2020-02-21 NOTE — Progress Notes (Signed)
Date:  02/21/2020   Name:  Brittany Patton   DOB:  October 31, 1960   MRN:  [id number]   Chief Complaint: foster child physical form and Hyperlipidemia (off of Simv. x 2 years)  Patient is a 59 year old female who presents for foster care physical exam form. The patient reports the following problems: none. Health maintenance has been reviewed colonoscopy/mammogram  Hyperlipidemia This is a chronic problem. The current episode started more than 1 year ago. The problem is uncontrolled. Recent lipid tests were reviewed and are variable. She has no history of chronic renal disease, hypothyroidism, liver disease or obesity. There are no known factors aggravating her hyperlipidemia. Pertinent negatives include no chest pain, focal sensory loss, focal weakness, leg pain, myalgias or shortness of breath. She is currently on no antihyperlipidemic treatment. The current treatment provides moderate improvement of lipids. There are no compliance problems.  Risk factors for coronary artery disease include obesity.    Lab Results  Component Value Date   CREATININE 0.80 01/11/2018   BUN 13 01/11/2018   NA 141 01/11/2018   K 4.4 01/11/2018   CL 103 01/11/2018   CO2 24 01/11/2018   Lab Results  Component Value Date   CHOL 203 (H) 01/11/2018   HDL 47 01/11/2018   LDLCALC 107 (H) 01/11/2018   TRIG 243 (H) 01/11/2018   CHOLHDL 4.3 01/11/2018   Lab Results  Component Value Date   TSH 1.140 03/08/2018   No results found for: HGBA1C Lab Results  Component Value Date   WBC 9.5 09/12/2015   HGB 13.8 03/08/2018   HCT 42.3 09/12/2015   MCV 82 09/12/2015   PLT 426 (H) 09/12/2015   Lab Results  Component Value Date   ALT 13 09/12/2015   AST 17 09/12/2015   ALKPHOS 119 (H) 09/12/2015   BILITOT 0.8 09/12/2015     Review of Systems  Constitutional: Negative.  Negative for chills, fatigue, fever and unexpected weight change.  HENT: Negative for congestion, ear discharge, ear pain, rhinorrhea, sinus  pressure, sneezing and sore throat.   Eyes: Negative for photophobia, pain, discharge, redness and itching.  Respiratory: Negative for cough, shortness of breath, wheezing and stridor.   Cardiovascular: Negative for chest pain.  Gastrointestinal: Negative for abdominal pain, blood in stool, constipation, diarrhea, nausea and vomiting.  Endocrine: Negative for cold intolerance, heat intolerance, polydipsia, polyphagia and polyuria.  Genitourinary: Negative for dysuria, flank pain, frequency, hematuria, menstrual problem, pelvic pain, urgency, vaginal bleeding and vaginal discharge.  Musculoskeletal: Negative for arthralgias, back pain and myalgias.  Skin: Negative for rash.  Allergic/Immunologic: Negative for environmental allergies and food allergies.  Neurological: Negative for dizziness, focal weakness, weakness, light-headedness, numbness and headaches.  Hematological: Negative for adenopathy. Does not bruise/bleed easily.  Psychiatric/Behavioral: Negative for dysphoric mood. The patient is not nervous/anxious.     Patient Active Problem List   Diagnosis Date Noted  . Hyperlipidemia 04/13/2016  . Esophageal reflux 04/13/2016    Allergies  Allergen Reactions  . Sulfa Antibiotics Hives    Past Surgical History:  Procedure Laterality Date  . CESAREAN SECTION     x 2  . CHOLECYSTECTOMY, LAPAROSCOPIC  2015  . COLONOSCOPY  2013   cleared for 10 yrs  . TONSILLECTOMY      Social History   Tobacco Use  . Smoking status: Former Smoker    Quit date: 07/06/1996    Years since quitting: 23.6  . Smokeless tobacco: Never Used  Vaping Use  .  Vaping Use: Never used  Substance Use Topics  . Alcohol use: Yes    Alcohol/week: 0.0 standard drinks    Comment: rarely  . Drug use: No     Medication list has been reviewed and updated.  Current Meds  Medication Sig  . [DISCONTINUED] famotidine (PEPCID) 40 MG tablet TAKE 1 TABLET BY MOUTH EVERY DAY    PHQ 2/9 Scores 02/21/2020  03/08/2018 01/11/2018 10/15/2015  PHQ - 2 Score 0 0 3 0  PHQ- 9 Score 0 4 9 -    GAD 7 : Generalized Anxiety Score 02/21/2020  Nervous, Anxious, on Edge 0  Control/stop worrying 0  Worry too much - different things 0  Trouble relaxing 0  Restless 0  Easily annoyed or irritable 0  Afraid - awful might happen 0  Total GAD 7 Score 0    BP Readings from Last 3 Encounters:  02/21/20 120/80  07/27/19 122/74  07/10/19 (!) 150/84    Physical Exam Vitals and nursing note reviewed.  Constitutional:      Appearance: She is well-developed.  HENT:     Head: Normocephalic.     Jaw: There is normal jaw occlusion.     Right Ear: Hearing, tympanic membrane, ear canal and external ear normal.     Left Ear: Hearing, tympanic membrane, ear canal and external ear normal.     Nose: Nose normal.     Mouth/Throat:     Lips: Pink.     Mouth: Mucous membranes are moist.     Dentition: Normal dentition.     Pharynx: Oropharynx is clear. Uvula midline.  Eyes:     General: Lids are everted, no foreign bodies appreciated. No scleral icterus.       Left eye: No foreign body or hordeolum.     Conjunctiva/sclera: Conjunctivae normal.     Right eye: Right conjunctiva is not injected.     Left eye: Left conjunctiva is not injected.     Pupils: Pupils are equal, round, and reactive to light.  Neck:     Thyroid: No thyromegaly.     Vascular: No JVD.     Trachea: No tracheal deviation.  Cardiovascular:     Rate and Rhythm: Normal rate and regular rhythm.     Chest Wall: PMI is not displaced. No thrill.     Pulses: Normal pulses.          Carotid pulses are 2+ on the right side and 2+ on the left side.      Radial pulses are 2+ on the right side and 2+ on the left side.       Femoral pulses are 2+ on the right side and 2+ on the left side.      Popliteal pulses are 2+ on the right side and 2+ on the left side.       Dorsalis pedis pulses are 2+ on the right side and 2+ on the left side.       Posterior  tibial pulses are 2+ on the right side and 2+ on the left side.     Heart sounds: Normal heart sounds, S1 normal and S2 normal. No murmur heard.  No systolic murmur is present.  No diastolic murmur is present.  No friction rub. No gallop. No S3 or S4 sounds.   Pulmonary:     Effort: Pulmonary effort is normal. No respiratory distress.     Breath sounds: Normal breath sounds. No wheezing, rhonchi or rales.  Chest:  Breasts:        Right: No swelling, bleeding, inverted nipple, mass, nipple discharge, skin change or tenderness.        Left: No swelling, bleeding, inverted nipple, mass, nipple discharge, skin change or tenderness.  Abdominal:     General: Bowel sounds are normal.     Palpations: Abdomen is soft. There is no mass.     Tenderness: There is no abdominal tenderness. There is no guarding or rebound.  Genitourinary:    Rectum: Guaiac result negative. External hemorrhoid present. No mass.  Musculoskeletal:        General: No tenderness. Normal range of motion.     Cervical back: Normal range of motion and neck supple.     Right lower leg: No edema.     Left lower leg: No edema.  Lymphadenopathy:     Cervical: No cervical adenopathy.  Skin:    General: Skin is warm.     Findings: No rash.  Neurological:     Mental Status: She is alert and oriented to person, place, and time.     Cranial Nerves: Cranial nerves are intact. No cranial nerve deficit.     Sensory: No sensory deficit.     Motor: Motor function is intact.     Coordination: Coordination is intact.     Gait: Gait is intact. Gait normal.     Deep Tendon Reflexes: Reflexes normal.  Psychiatric:        Mood and Affect: Mood is not anxious or depressed.     Wt Readings from Last 3 Encounters:  02/21/20 221 lb (100.2 kg)  07/27/19 220 lb (99.8 kg)  07/10/19 218 lb (98.9 kg)    BP 120/80   Pulse 80   Ht 5\' 3"  (1.6 m)   Wt 221 lb (100.2 kg)   BMI 39.15 kg/m   Assessment and Plan:  1. Physical exam,  pre-employment Physical exam for foster care.  No subjective objective concerns noted on history and physical exam.  Review of patient's chart notes normal encounters, most recent labs normal most recent imaging normal and care everywhere normal.Jakalyn L Plitt is a 59 y.o. female who presents today for her Complete Annual Exam. She feels well. She reports exercising occasional. She reports she is sleeping well. Immunizations are reviewed and recommendations provided.   Age appropriate screening tests are discussed. Counseling given for risk factor reduction interventions.  2. Mixed hyperlipidemia Patient with history of hyperlipidemia in the past.  Will check lipid panel while fasting to determine if we need to do any further intervention - Lipid Panel With LDL/HDL Ratio  3. Colon cancer screening Rectal exam was normal on evaluation with no palpable mass.  Discussed screening for colon cancer by means FIT.  Guaiac today was negative.

## 2020-02-22 ENCOUNTER — Other Ambulatory Visit: Payer: Self-pay

## 2020-02-22 DIAGNOSIS — E782 Mixed hyperlipidemia: Secondary | ICD-10-CM

## 2020-02-22 LAB — LIPID PANEL WITH LDL/HDL RATIO
Cholesterol, Total: 296 mg/dL — ABNORMAL HIGH (ref 100–199)
HDL: 49 mg/dL (ref >39)
LDL Chol Calc (NIH): 211 mg/dL — ABNORMAL HIGH (ref 0–99)
LDL/HDL Ratio: 4.3 ratio — ABNORMAL HIGH (ref 0.0–3.2)
Triglycerides: 187 mg/dL — ABNORMAL HIGH (ref 0–149)
VLDL Cholesterol Cal: 36 mg/dL (ref 5–40)

## 2020-02-22 MED ORDER — ROSUVASTATIN CALCIUM 10 MG PO TABS: 10.0000 mg | ORAL_TABLET | Freq: Every day | ORAL | 1 refills | Status: DC

## 2020-02-22 NOTE — Progress Notes (Unsigned)
Sent in [facility].  

## 2020-03-07 ENCOUNTER — Other Ambulatory Visit: Payer: Self-pay | Admitting: Family Medicine

## 2020-03-07 DIAGNOSIS — E782 Mixed hyperlipidemia: Secondary | ICD-10-CM

## 2020-04-20 IMAGING — CR DG ABDOMEN 1V
4 series · 4 of 4 positions shown · non-contrast
Comparison: None.

CLINICAL DATA: Abdominal pain for 2 days, initial encounter

EXAM:
ABDOMEN - 1 VIEW

[abdomen kub (1 of 4)]
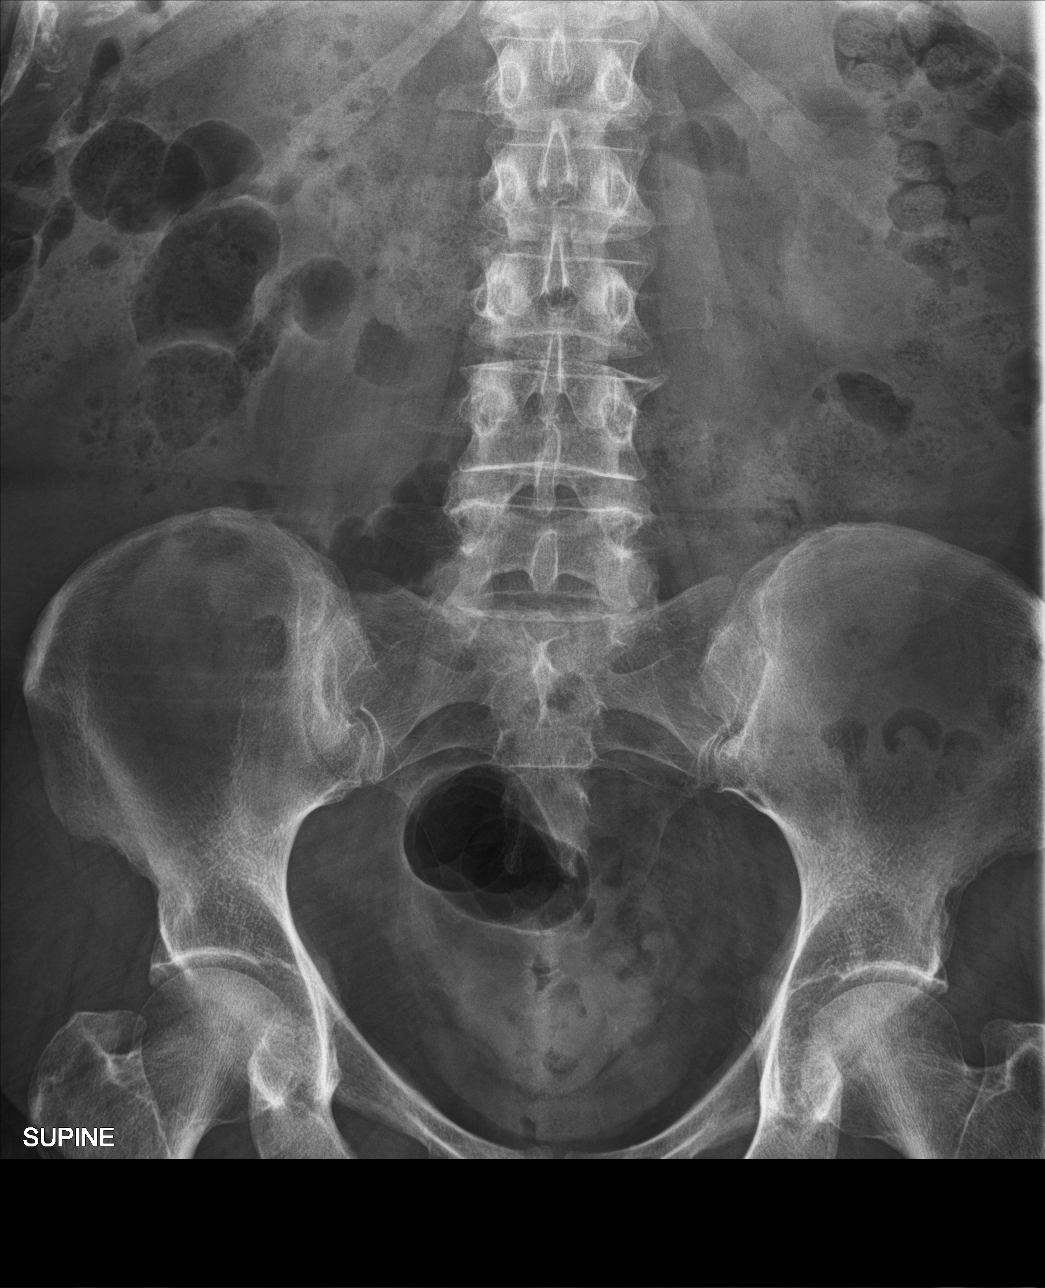

[abdomen kub (2 of 4)]
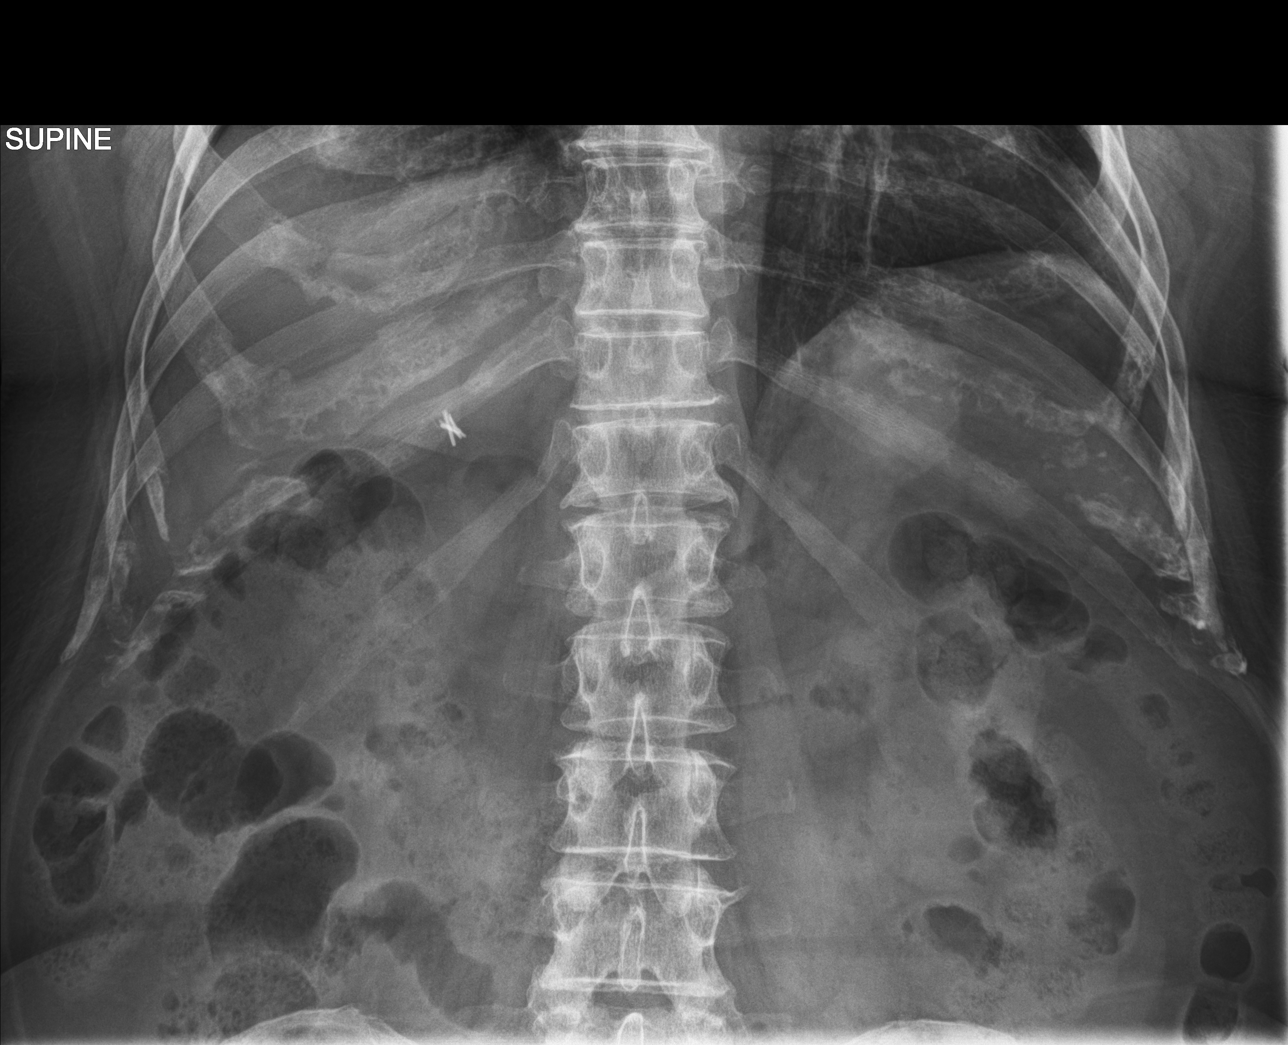

[abdomen kub (3 of 4)]
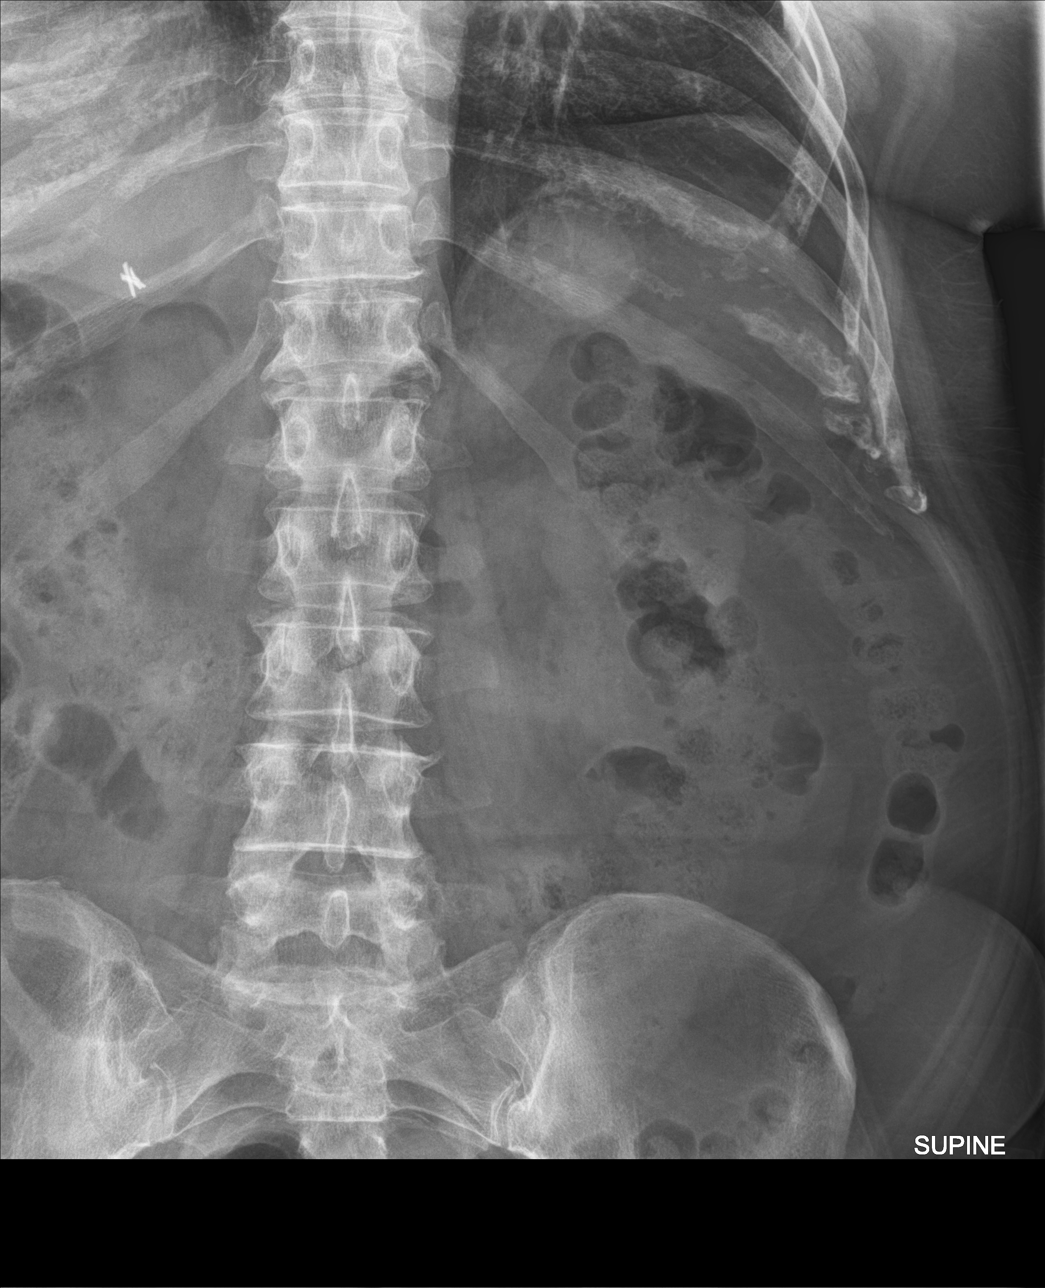

[abdomen kub (4 of 4)]
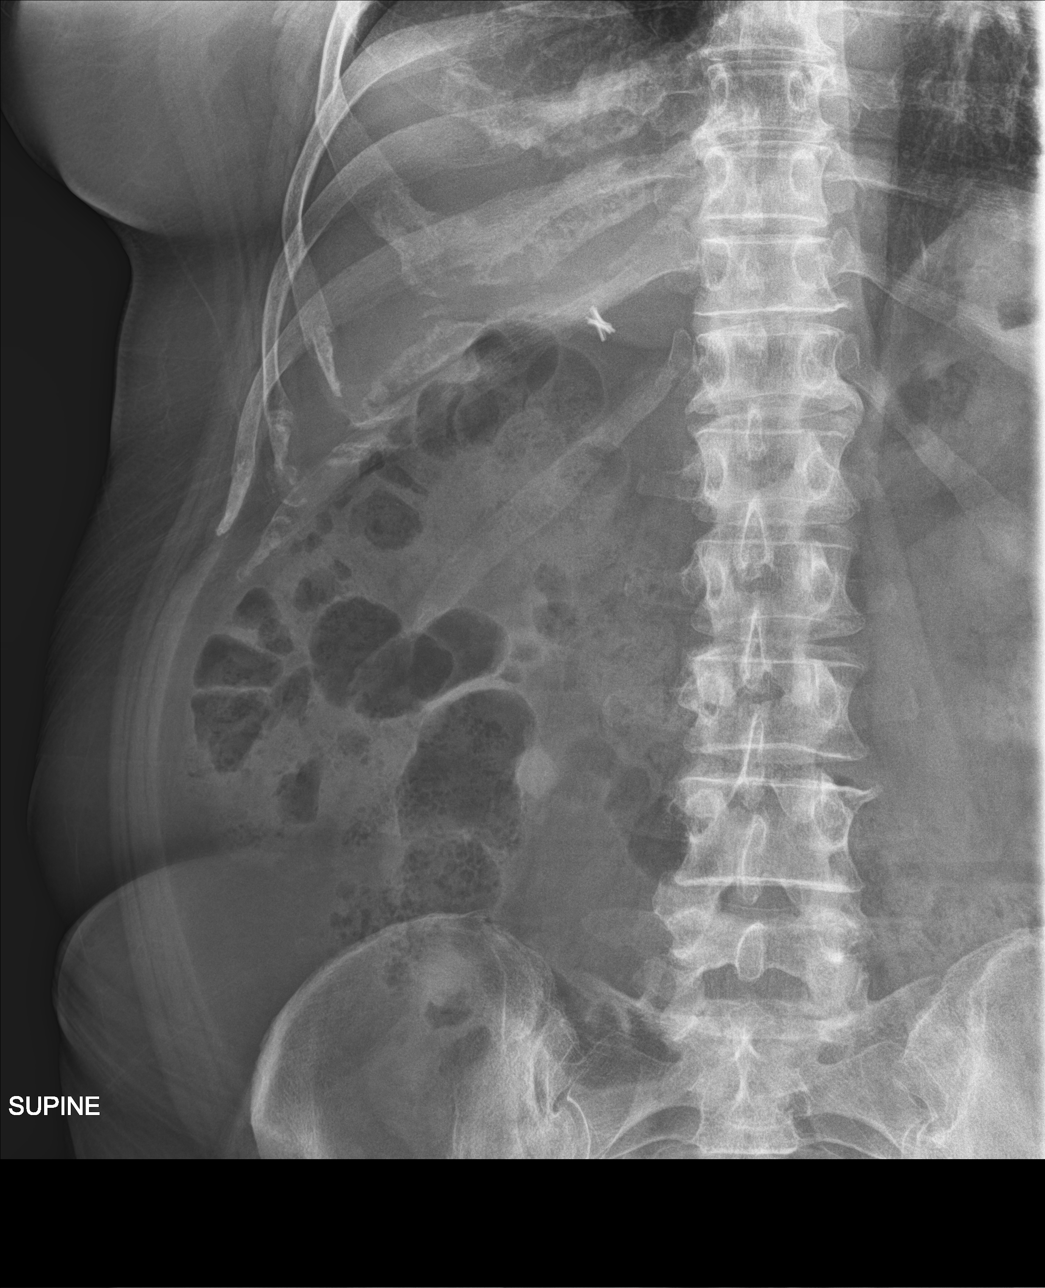

[4 of 4 positions shown; findings below may reference images not displayed]

FINDINGS: Scattered large and small bowel gas is noted. Fecal material is
noted within the colon consistent with a mild degree of
constipation. No obstructive changes are seen. Mild degenerative
changes of the lumbar spine are noted. No free air is seen. No mass
lesion is noted.
IMPRESSION: Mild constipation.

No acute abnormality noted.

## 2020-08-13 ENCOUNTER — Other Ambulatory Visit: Payer: Self-pay

## 2020-08-13 ENCOUNTER — Encounter: Payer: Self-pay | Admitting: Family Medicine

## 2020-08-13 ENCOUNTER — Ambulatory Visit (INDEPENDENT_AMBULATORY_CARE_PROVIDER_SITE_OTHER): Payer: Self-pay | Admitting: Family Medicine

## 2020-08-13 VITALS — BP 134/80 | HR 85 | Temp 97.9°F | Ht 63.0 in | Wt 211.2 lb

## 2020-08-13 DIAGNOSIS — R059 Cough, unspecified: Secondary | ICD-10-CM

## 2020-08-13 DIAGNOSIS — J40 Bronchitis, not specified as acute or chronic: Secondary | ICD-10-CM

## 2020-08-13 DIAGNOSIS — J0100 Acute maxillary sinusitis, unspecified: Secondary | ICD-10-CM

## 2020-08-13 DIAGNOSIS — E782 Mixed hyperlipidemia: Secondary | ICD-10-CM

## 2020-08-13 MED ORDER — GUAIFENESIN-CODEINE 100-10 MG/5ML PO SYRP: 5.0000 mL | ORAL_SOLUTION | Freq: Four times a day (QID) | ORAL | 0 refills | Status: DC | PRN

## 2020-08-13 MED ORDER — AZITHROMYCIN 250 MG PO TABS: ORAL_TABLET | ORAL | 0 refills | Status: DC

## 2020-08-13 MED ORDER — ATORVASTATIN CALCIUM 10 MG PO TABS: 10.0000 mg | ORAL_TABLET | Freq: Every day | ORAL | 3 refills | Status: DC

## 2020-08-13 NOTE — Progress Notes (Signed)
Date:  08/13/2020   Name:  Brittany Patton   DOB:  Dec 12, 1960   MRN:  [id number]   Chief Complaint: Cough (Started Friday. Cough with production. Unsure of color. Cough is keeping her from sleeping. Sneezing. No fever. ) and Hyperlipidemia (Needs refill sent to pharmacy - said she never picked it up from [facility]. Switching to [facility] and needs sent over today.-)  Cough This is a new problem. The current episode started in the past 7 days. The problem has been unchanged. The problem occurs every few minutes. The cough is productive of purulent sputum. Associated symptoms include nasal congestion, postnasal drip and rhinorrhea. Pertinent negatives include no chest pain, chills, ear pain, eye redness, fever, headaches, hemoptysis, myalgias, rash, sore throat, shortness of breath or wheezing. Associated symptoms comments: stridor. She has tried OTC cough suppressant for the symptoms. There is no history of environmental allergies.  Hyperlipidemia This is a chronic problem. The current episode started more than 1 month ago. The problem is uncontrolled. Recent lipid tests were reviewed and are high. She has no history of chronic renal disease, diabetes, hypothyroidism, liver disease, obesity or nephrotic syndrome. Pertinent negatives include no chest pain, focal sensory loss, focal weakness, leg pain, myalgias or shortness of breath. Current antihyperlipidemic treatment includes diet change. There are no compliance problems.   Sinusitis This is a new problem. The current episode started in the past 7 days. The problem has been waxing and waning since onset. There has been no fever. The pain is mild. Associated symptoms include congestion, coughing and sinus pressure. Pertinent negatives include no chills, ear pain, headaches, shortness of breath, sneezing or sore throat.    Lab Results  Component Value Date   CREATININE 0.80 01/11/2018   BUN 13 01/11/2018   NA 141 01/11/2018   K 4.4 01/11/2018   CL  103 01/11/2018   CO2 24 01/11/2018   Lab Results  Component Value Date   CHOL 296 (H) 02/21/2020   HDL 49 02/21/2020   LDLCALC 211 (H) 02/21/2020   TRIG 187 (H) 02/21/2020   CHOLHDL 4.3 01/11/2018   Lab Results  Component Value Date   TSH 1.140 03/08/2018   No results found for: HGBA1C Lab Results  Component Value Date   WBC 9.5 09/12/2015   HGB 13.8 03/08/2018   HCT 42.3 09/12/2015   MCV 82 09/12/2015   PLT 426 (H) 09/12/2015   Lab Results  Component Value Date   ALT 13 09/12/2015   AST 17 09/12/2015   ALKPHOS 119 (H) 09/12/2015   BILITOT 0.8 09/12/2015     Review of Systems  Constitutional: Negative.  Negative for chills, fatigue, fever and unexpected weight change.  HENT: Positive for congestion, postnasal drip, rhinorrhea and sinus pressure. Negative for ear discharge, ear pain, sneezing and sore throat.   Eyes: Negative for double vision, photophobia, pain, discharge, redness and itching.  Respiratory: Positive for cough. Negative for hemoptysis, shortness of breath, wheezing and stridor.   Cardiovascular: Negative for chest pain.  Gastrointestinal: Negative for abdominal pain, blood in stool, constipation, diarrhea, nausea and vomiting.  Endocrine: Negative for cold intolerance, heat intolerance, polydipsia, polyphagia and polyuria.  Genitourinary: Negative for dysuria, flank pain, frequency, hematuria, menstrual problem, pelvic pain, urgency, vaginal bleeding and vaginal discharge.  Musculoskeletal: Negative for arthralgias, back pain and myalgias.  Skin: Negative for rash.  Allergic/Immunologic: Negative for environmental allergies and food allergies.  Neurological: Negative for dizziness, focal weakness, weakness, light-headedness, numbness and headaches.  Hematological:  Negative for adenopathy. Does not bruise/bleed easily.  Psychiatric/Behavioral: Negative for dysphoric mood. The patient is not nervous/anxious.     Patient Active Problem List   Diagnosis  Date Noted   Hyperlipidemia 04/13/2016   Esophageal reflux 04/13/2016    Allergies  Allergen Reactions   Sulfa Antibiotics Hives    Past Surgical History:  Procedure Laterality Date   CESAREAN SECTION     x 2   CHOLECYSTECTOMY, LAPAROSCOPIC  2015   COLONOSCOPY  2013   cleared for 10 yrs   TONSILLECTOMY      Social History   Tobacco Use   Smoking status: Former Smoker    Quit date: 07/06/1996    Years since quitting: 24.1   Smokeless tobacco: Never Used  Vaping Use   Vaping Use: Never used  Substance Use Topics   Alcohol use: Yes    Alcohol/week: 0.0 standard drinks    Comment: rarely   Drug use: No     Medication list has been reviewed and updated.  Current Meds  Medication Sig   famotidine (PEPCID) 20 MG tablet Take 20 mg by mouth daily as needed for heartburn or indigestion.    PHQ 2/9 Scores 08/13/2020 02/21/2020 03/08/2018 01/11/2018  PHQ - 2 Score 0 0 0 3  PHQ- 9 Score 0 0 4 9    GAD 7 : Generalized Anxiety Score 08/13/2020 02/21/2020  Nervous, Anxious, on Edge 0 0  Control/stop worrying 0 0  Worry too much - different things 0 0  Trouble relaxing 0 0  Restless 0 0  Easily annoyed or irritable 0 0  Afraid - awful might happen 0 0  Total GAD 7 Score 0 0  Anxiety Difficulty Not difficult at all -    BP Readings from Last 3 Encounters:  08/13/20 134/80  02/21/20 120/80  07/27/19 122/74    Physical Exam Vitals reviewed.  Constitutional:      Appearance: She is well-developed and well-nourished.  HENT:     Head: Normocephalic.     Jaw: There is normal jaw occlusion.     Right Ear: Hearing, tympanic membrane, ear canal and external ear normal.     Left Ear: Hearing, tympanic membrane, ear canal and external ear normal.     Nose: Congestion present. No rhinorrhea.     Right Turbinates: Enlarged.     Left Turbinates: Enlarged.     Right Sinus: No maxillary sinus tenderness or frontal sinus tenderness.     Left Sinus: No maxillary  sinus tenderness or frontal sinus tenderness.     Mouth/Throat:     Mouth: Oropharynx is clear and moist.     Palate: No mass.     Pharynx: Oropharynx is clear. Uvula midline.  Eyes:     General: Lids are everted, no foreign bodies appreciated. No scleral icterus.       Left eye: No foreign body or hordeolum.     Extraocular Movements: EOM normal.     Conjunctiva/sclera: Conjunctivae normal.     Right eye: Right conjunctiva is not injected.     Left eye: Left conjunctiva is not injected.     Pupils: Pupils are equal, round, and reactive to light.  Neck:     Thyroid: No thyroid mass, thyromegaly or thyroid tenderness.     Vascular: No JVD.     Trachea: No tracheal deviation.  Cardiovascular:     Rate and Rhythm: Normal rate and regular rhythm.     Pulses: Intact distal pulses.  Heart sounds: Normal heart sounds, S1 normal and S2 normal. No murmur heard.  No systolic murmur is present.  No diastolic murmur is present. No friction rub. No gallop. No S3 or S4 sounds.   Pulmonary:     Effort: Pulmonary effort is normal. No respiratory distress.     Breath sounds: Normal breath sounds and air entry. No stridor or decreased air movement. No decreased breath sounds, wheezing, rhonchi or rales.  Abdominal:     General: Bowel sounds are normal.     Palpations: Abdomen is soft. There is no hepatosplenomegaly or mass.     Tenderness: There is no abdominal tenderness. There is no guarding or rebound.  Musculoskeletal:        General: No tenderness or edema. Normal range of motion.     Cervical back: Normal range of motion and neck supple.  Lymphadenopathy:     Cervical: No cervical adenopathy.  Skin:    General: Skin is warm.     Findings: No rash.  Neurological:     Mental Status: She is alert and oriented to person, place, and time.     Cranial Nerves: No cranial nerve deficit.     Deep Tendon Reflexes: Strength normal. Reflexes normal.  Psychiatric:        Mood and Affect: Mood  and affect normal. Mood is not anxious or depressed.     Wt Readings from Last 3 Encounters:  08/13/20 211 lb 3.2 oz (95.8 kg)  02/21/20 221 lb (100.2 kg)  07/27/19 220 lb (99.8 kg)    BP 134/80    Pulse 85    Temp 97.9 F (36.6 C) (Oral)    Ht 5\' 3"  (1.6 m)    Wt 211 lb 3.2 oz (95.8 kg)    SpO2 95%    BMI 37.41 kg/m   Assessment and Plan:  1. Acute non-recurrent maxillary sinusitis New onset.  Persistent.  Relatively stable.  But is ongoing and we have a holiday coming.  We will initiate a azithromycin to 50 mg 2 today followed by 1 a day for 4 days. - azithromycin (ZITHROMAX) 250 MG tablet; 2 today then 1 a day for 4 days  Dispense: 6 tablet; Refill: 0  2. Mixed hyperlipidemia Chronic.  Likely persistent.  Stable.  Patient was noted to have an LDL over 200 in July but the rosuvastatin was expensive and was unable to initiate.  We will change prescription to atorvastatin 10 mg once a day and will recheck patient in 8 weeks to recheck lipid in a fasting state. - atorvastatin (LIPITOR) 10 MG tablet; Take 1 tablet (10 mg total) by mouth daily.  Dispense: 90 tablet; Refill: 3  3. Bronchitis New onset.  Persistent.  Patient does have some mild rhonchi in the lungs consistent with bronchitis.  Will put on expectorant with codeine preparation so she can get some rest at night. - guaiFENesin-codeine (ROBITUSSIN AC) 100-10 MG/5ML syrup; Take 5 mLs by mouth 4 (four) times daily as needed for cough.  Dispense: 118 mL; Refill: 0  4. Cough As noted above Robitussin-AC 1 teaspoon 4 times a day for 5 days for relief from cough. - guaiFENesin-codeine (ROBITUSSIN AC) 100-10 MG/5ML syrup; Take 5 mLs by mouth 4 (four) times daily as needed for cough.  Dispense: 118 mL; Refill: 0

## 2020-10-08 ENCOUNTER — Ambulatory Visit: Payer: Self-pay | Admitting: Family Medicine

## 2020-10-28 ENCOUNTER — Other Ambulatory Visit: Payer: Self-pay

## 2020-10-28 DIAGNOSIS — R69 Illness, unspecified: Secondary | ICD-10-CM

## 2020-10-28 DIAGNOSIS — E782 Mixed hyperlipidemia: Secondary | ICD-10-CM

## 2020-10-29 ENCOUNTER — Other Ambulatory Visit: Payer: Self-pay

## 2020-10-29 DIAGNOSIS — E782 Mixed hyperlipidemia: Secondary | ICD-10-CM

## 2020-10-29 LAB — LIPID PANEL WITH LDL/HDL RATIO
Cholesterol, Total: 219 mg/dL — ABNORMAL HIGH (ref 100–199)
HDL: 49 mg/dL (ref >39)
LDL Chol Calc (NIH): 128 mg/dL — ABNORMAL HIGH (ref 0–99)
LDL/HDL Ratio: 2.6 ratio (ref 0.0–3.2)
Triglycerides: 235 mg/dL — ABNORMAL HIGH (ref 0–149)
VLDL Cholesterol Cal: 42 mg/dL — ABNORMAL HIGH (ref 5–40)

## 2020-10-29 LAB — HEPATIC FUNCTION PANEL
ALT: 16 IU/L (ref 0–32)
AST: 13 IU/L (ref 0–40)
Albumin: 4.5 g/dL (ref 3.8–4.9)
Alkaline Phosphatase: 107 IU/L (ref 44–121)
Bilirubin Total: 0.9 mg/dL (ref 0.0–1.2)
Bilirubin, Direct: 0.17 mg/dL (ref 0.00–0.40)
Total Protein: 7.1 g/dL (ref 6.0–8.5)

## 2020-10-29 MED ORDER — EZETIMIBE 10 MG PO TABS: 10.0000 mg | ORAL_TABLET | Freq: Every day | ORAL | 0 refills | Status: DC

## 2021-04-25 ENCOUNTER — Ambulatory Visit
Admission: EM | Admit: 2021-04-25 | Discharge: 2021-04-25 | Disposition: A | Discharge status: Home / Self Care | Payer: Medicaid Other | Attending: Physician Assistant | Admitting: Physician Assistant

## 2021-04-25 ENCOUNTER — Other Ambulatory Visit: Payer: Self-pay

## 2021-04-25 DIAGNOSIS — J029 Acute pharyngitis, unspecified: Secondary | ICD-10-CM | POA: Insufficient documentation

## 2021-04-25 DIAGNOSIS — Z87891 Personal history of nicotine dependence: Secondary | ICD-10-CM | POA: Insufficient documentation

## 2021-04-25 DIAGNOSIS — J069 Acute upper respiratory infection, unspecified: Secondary | ICD-10-CM

## 2021-04-25 DIAGNOSIS — H1033 Unspecified acute conjunctivitis, bilateral: Secondary | ICD-10-CM

## 2021-04-25 DIAGNOSIS — Z888 Allergy status to other drugs, medicaments and biological substances status: Secondary | ICD-10-CM | POA: Insufficient documentation

## 2021-04-25 DIAGNOSIS — Z9049 Acquired absence of other specified parts of digestive tract: Secondary | ICD-10-CM | POA: Insufficient documentation

## 2021-04-25 DIAGNOSIS — R0981 Nasal congestion: Secondary | ICD-10-CM

## 2021-04-25 DIAGNOSIS — Z79899 Other long term (current) drug therapy: Secondary | ICD-10-CM | POA: Insufficient documentation

## 2021-04-25 DIAGNOSIS — Z882 Allergy status to sulfonamides status: Secondary | ICD-10-CM | POA: Insufficient documentation

## 2021-04-25 DIAGNOSIS — Z20822 Contact with and (suspected) exposure to covid-19: Secondary | ICD-10-CM | POA: Insufficient documentation

## 2021-04-25 LAB — POCT RAPID STREP A: Streptococcus, Group A Screen (Direct): NEGATIVE

## 2021-04-25 MED ORDER — FLUTICASONE PROPIONATE 50 MCG/ACT NA SUSP: 2.0000 | Freq: Every day | NASAL | 0 refills | Status: DC

## 2021-04-25 MED ORDER — LIDOCAINE VISCOUS HCL 2 % MT SOLN: 15.0000 mL | OROMUCOSAL | 0 refills | Status: AC | PRN

## 2021-04-25 MED ORDER — MOXIFLOXACIN HCL 0.5 % OP SOLN: 1.0000 [drp] | Freq: Three times a day (TID) | OPHTHALMIC | 0 refills | Status: AC

## 2021-04-25 NOTE — ED Provider Notes (Signed)
[facility]    CSN: [id number] Arrival date & time: 04/25/21  0946      History   Chief Complaint No chief complaint on file.   HPI Brittany Patton is a 60 y.o. female presenting for 3-day history of nasal congestion, fatigue, bilateral eye pain/redness/discharge, facial pressure/pain, cough and sore throat.  Patient states she was around her granddaughter who was recently diagnosed with RSV.  She denies any known COVID exposure.  She has been vaccinated for COVID-19 x2 and boosted.  Patient denying any chest pain, breathing difficulty, nausea/vomiting/diarrhea.  She has not really been taking any OTC meds.  She is otherwise healthy with a history of GERD and hyperlipidemia.  No other complaints or concerns.  HPI  Past Medical History:  Diagnosis Date   Depression    GERD (gastroesophageal reflux disease)    Hyperlipidemia     Patient Active Problem List   Diagnosis Date Noted   Hyperlipidemia 04/13/2016   Esophageal reflux 04/13/2016    Past Surgical History:  Procedure Laterality Date   CESAREAN SECTION     x 2   CHOLECYSTECTOMY, LAPAROSCOPIC  2015   COLONOSCOPY  2013   cleared for 10 yrs   TONSILLECTOMY      OB History     Gravida  2   Para  2   Term  2   Preterm      AB      Living  2      SAB      IAB      Ectopic      Multiple      Live Births  2            Home Medications    Prior to Admission medications   Medication Sig Start Date End Date Taking? Authorizing Provider  fluticasone (FLONASE) 50 MCG/ACT nasal spray Place 2 sprays into both nostrils daily for 7 days. 04/25/21 05/02/21 Yes Eusebio Friendly B, PA-C  lidocaine (XYLOCAINE) 2 % solution Use as directed 15 mLs in the mouth or throat every 3 (three) hours as needed for mouth pain (swish and spit). 04/25/21 08/03/21 Yes Eusebio Friendly B, PA-C  moxifloxacin (VIGAMOX) 0.5 % ophthalmic solution Place 1 drop into both eyes 3 (three) times daily for 7 days. 04/25/21 05/02/21 Yes  Shirlee Latch, PA-C  ezetimibe (ZETIA) 10 MG tablet Take 1 tablet (10 mg total) by mouth daily. 10/29/20   Duanne Limerick, MD  famotidine (PEPCID) 20 MG tablet Take 20 mg by mouth daily as needed for heartburn or indigestion.    [provider]  rosuvastatin (CRESTOR) 10 MG tablet Take 1 tablet (10 mg total) by mouth daily. Patient not taking: No sig reported 02/22/20   Duanne Limerick, MD    Family History Family History  Problem Relation Age of Onset   Diabetes Mother    Breast cancer Maternal Aunt 1   Hypertension Father    Heart disease Father    Glaucoma Father     Social History Social History   Tobacco Use   Smoking status: Former    Types: Cigarettes    Quit date: 07/06/1996    Years since quitting: 24.8   Smokeless tobacco: Never  Vaping Use   Vaping Use: Never used  Substance Use Topics   Alcohol use: Yes    Alcohol/week: 0.0 standard drinks    Comment: rarely   Drug use: No     Allergies   Statins  and Sulfa antibiotics   Review of Systems Review of Systems  Constitutional:  Positive for fatigue. Negative for chills, diaphoresis and fever.  HENT:  Positive for congestion, rhinorrhea, sinus pressure, sinus pain and sore throat. Negative for ear pain.   Eyes:  Positive for pain, redness and itching.  Respiratory:  Positive for cough. Negative for shortness of breath.   Gastrointestinal:  Negative for abdominal pain, nausea and vomiting.  Musculoskeletal:  Negative for arthralgias and myalgias.  Skin:  Negative for rash.  Neurological:  Negative for weakness and headaches.  Hematological:  Negative for adenopathy.    Physical Exam Triage Vital Signs ED Triage Vitals  Enc Vitals Group     BP 04/25/21 1045 137/68     Pulse Rate 04/25/21 1045 88     Resp 04/25/21 1045 18     Temp 04/25/21 1045 98.7 F (37.1 C)     Temp Source 04/25/21 1045 Oral     SpO2 04/25/21 1045 96 %     Weight 04/25/21 1044 210 lb (95.3 kg)     Height 04/25/21 1044  5\' 2"  (1.575 m)     Head Circumference --      Peak Flow --      Pain Score 04/25/21 1044 0     Pain Loc --      Pain Edu? --      Excl. in GC? --    No data found.  Updated Vital Signs BP 137/68 (BP Location: Left Arm)   Pulse 88   Temp 98.7 F (37.1 C) (Oral)   Resp 18   Ht 5\' 2"  (1.575 m)   Wt 210 lb (95.3 kg)   SpO2 96%   BMI 38.41 kg/m      Physical Exam Vitals and nursing note reviewed.  Constitutional:      General: She is not in acute distress.    Appearance: Normal appearance. She is not ill-appearing or toxic-appearing.  HENT:     Head: Normocephalic and atraumatic.     Right Ear: Tympanic membrane, ear canal and external ear normal.     Left Ear: Tympanic membrane, ear canal and external ear normal.     Nose: Congestion present.     Mouth/Throat:     Mouth: Mucous membranes are moist.     Pharynx: Oropharynx is clear. Posterior oropharyngeal erythema (mild) present.  Eyes:     General: No scleral icterus.       Right eye: Discharge (scant thick yellowish drainage noted) present.        Left eye: No discharge.     Conjunctiva/sclera:     Right eye: Right conjunctiva is injected.     Left eye: Left conjunctiva is injected.  Cardiovascular:     Rate and Rhythm: Normal rate and regular rhythm.     Heart sounds: Normal heart sounds.  Pulmonary:     Effort: Pulmonary effort is normal. No respiratory distress.     Breath sounds: Normal breath sounds.  Musculoskeletal:     Cervical back: Neck supple.  Skin:    General: Skin is dry.  Neurological:     General: No focal deficit present.     Mental Status: She is alert. Mental status is at baseline.     Motor: No weakness.     Gait: Gait normal.  Psychiatric:        Mood and Affect: Mood normal.        Behavior: Behavior normal.  Thought Content: Thought content normal.     UC Treatments / Results  Labs (all labs ordered are listed, but only abnormal results are displayed) Labs Reviewed  SARS  CORONAVIRUS 2 (TAT 6-24 HRS)  CULTURE, GROUP A STREP [facility])  POCT RAPID STREP A, ED / UC  POCT RAPID STREP A    EKG   Radiology No results found.  Procedures Procedures (including critical care time)  Medications Ordered in UC Medications - No data to display  Initial Impression / Assessment and Plan / UC Course  I have reviewed the triage vital signs and the nursing notes.  Pertinent labs & imaging results that were available during my care of the patient were reviewed by me and considered in my medical decision making (see chart for details).  60 year old female presenting for fatigue, bilateral eye redness/drainage/pain, nasal congestion and sinus pressure, cough and sore throat.  Has been exposed to RSV through her grandchild.  Admitted to fevers on the first day of 100 degrees but no continued fevers.  Patient is ill-appearing but not toxic.  She does have erythema and discharge of conjunctiva.  Positive nasal congestion and posterior pharyngeal erythema.  Chest is clear to auscultation heart regular rate and rhythm.  Rapid strep test obtained. Negative. Culture sent.  PCR COVID test obtained.  Current CDC guidelines, isolation protocol and ED precautions reviewed with patient.  Advised patient her symptoms are likely viral as well as viral conjunctivitis but given the fact that she has discolored eye drainage, we will treat her with Vigamox to cover possible bacterial infection.   Supportive care encouraged.  I have sent viscous lidocaine, fluticasone nasal spray, and the Vigamox.  Advised her to use over-the-counter Mucinex D increase rest and fluids.  If she does test positive for COVID-19 she would be a candidate for antiviral therapy and reviewed how to access results.  She should follow-up for any worsening of symptoms or if she is not better after 10 days.  Final Clinical Impressions(s) / UC Diagnoses   Final diagnoses:  Acute upper respiratory infection  Sore  throat  Acute conjunctivitis of both eyes, unspecified acute conjunctivitis type  Nasal congestion     Discharge Instructions      URI/COLD SYMPTOMS: Negative strep. Your exam today is consistent with a viral illness. Antibiotics are not indicated at this time. Use medications as directed, including cough syrup (Try OTC Mucinex), nasal saline, and decongestants. Your symptoms should improve over the next few days and resolve within 7-10 days. Increase rest and fluids. F/u if symptoms worsen or predominate such as sore throat, ear pain, productive cough, shortness of breath, or if you develop high fevers or worsening fatigue over the next several days.    You have received COVID testing today either for positive exposure, concerning symptoms that could be related to COVID infection, screening purposes, or re-testing after confirmed positive.  Your test obtained today checks for active viral infection in the last 1-2 weeks. If your test is negative now, you can still test positive later. So, if you do develop symptoms you should either get re-tested and/or isolate x 5 days and then strict mask use x 5 days (unvaccinated) or mask use x 10 days (vaccinated). Please follow CDC guidelines.  While Rapid antigen tests come back in 15-20 minutes, send out PCR/molecular test results typically come back within 1-3 days. In the mean time, if you are symptomatic, assume this could be a positive test and treat/monitor yourself as if  you do have COVID.   We will call with test results if positive. Please download the MyChart app and set up a profile to access test results.   If symptomatic, go home and rest. Push fluids. Take Tylenol as needed for discomfort. Gargle warm salt water. Throat lozenges. Take Mucinex DM or Robitussin for cough. Humidifier in bedroom to ease coughing. Warm showers. Also review the COVID handout for more information.  COVID-19 INFECTION: The incubation period of COVID-19 is  approximately 14 days after exposure, with most symptoms developing in roughly 4-5 days. Symptoms may range in severity from mild to critically severe. Roughly 80% of those infected will have mild symptoms. People of any age may become infected with COVID-19 and have the ability to transmit the virus. The most common symptoms include: fever, fatigue, cough, body aches, headaches, sore throat, nasal congestion, shortness of breath, nausea, vomiting, diarrhea, changes in smell and/or taste.    COURSE OF ILLNESS Some patients may begin with mild disease which can progress quickly into critical symptoms. If your symptoms are worsening please call ahead to the Emergency Department and proceed there for further treatment. Recovery time appears to be roughly 1-2 weeks for mild symptoms and 3-6 weeks for severe disease.   GO IMMEDIATELY TO ER FOR FEVER YOU ARE UNABLE TO GET DOWN WITH TYLENOL, BREATHING PROBLEMS, CHEST PAIN, FATIGUE, LETHARGY, INABILITY TO EAT OR DRINK, ETC  QUARANTINE AND ISOLATION: To help decrease the spread of COVID-19 please remain isolated if you have COVID infection or are highly suspected to have COVID infection. This means -stay home and isolate to one room in the home if you live with others. Do not share a bed or bathroom with others while ill, sanitize and wipe down all countertops and keep common areas clean and disinfected. Stay home for 5 days. If you have no symptoms or your symptoms are resolving after 5 days, you can leave your house. Continue to wear a mask around others for 5 additional days. If you have been in close contact (within 6 feet) of someone diagnosed with COVID 19, you are advised to quarantine in your home for 14 days as symptoms can develop anywhere from 2-14 days after exposure to the virus. If you develop symptoms, you  must isolate.  Most current guidelines for COVID after exposure -unvaccinated: isolate 5 days and strict mask use x 5 days. Test on day 5 is  possible -vaccinated: wear mask x 10 days if symptoms do not develop -You do not necessarily need to be tested for COVID if you have + exposure and  develop symptoms. Just isolate at home x10 days from symptom onset During this global pandemic, CDC advises to practice social distancing, try to stay at least 316ft away from others at all times. Wear a face covering. Wash and sanitize your hands regularly and avoid going anywhere that is not necessary.  KEEP IN MIND THAT THE COVID TEST IS NOT 100% ACCURATE AND YOU SHOULD STILL DO EVERYTHING TO PREVENT POTENTIAL SPREAD OF VIRUS TO OTHERS (WEAR MASK, WEAR GLOVES, WASH HANDS AND SANITIZE REGULARLY). IF INITIAL TEST IS NEGATIVE, THIS MAY NOT MEAN YOU ARE DEFINITELY NEGATIVE. MOST ACCURATE TESTING IS DONE 5-7 DAYS AFTER EXPOSURE.   It is not advised by CDC to get re-tested after receiving a positive COVID test since you can still test positive for weeks to months after you have already cleared the virus.   *If you have not been vaccinated for COVID, I strongly suggest  you consider getting vaccinated as long as there are no contraindications.       ED Prescriptions     Medication Sig Dispense Auth. Provider   moxifloxacin (VIGAMOX) 0.5 % ophthalmic solution Place 1 drop into both eyes 3 (three) times daily for 7 days. 3 mL Eusebio Friendly B, PA-C   lidocaine (XYLOCAINE) 2 % solution Use as directed 15 mLs in the mouth or throat every 3 (three) hours as needed for mouth pain (swish and spit). 100 mL Eusebio Friendly B, PA-C   fluticasone (FLONASE) 50 MCG/ACT nasal spray Place 2 sprays into both nostrils daily for 7 days. 1 g Shirlee Latch, PA-C      PDMP not reviewed this encounter.   Shirlee Latch, PA-C 04/25/21 1138

## 2021-04-25 NOTE — Discharge Instructions (Addendum)
URI/COLD SYMPTOMS: Negative strep. Your exam today is consistent with a viral illness. Antibiotics are not indicated at this time. Use medications as directed, including cough syrup (Try OTC Mucinex), nasal saline, and decongestants. Your symptoms should improve over the next few days and resolve within 7-10 days. Increase rest and fluids. F/u if symptoms worsen or predominate such as sore throat, ear pain, productive cough, shortness of breath, or if you develop high fevers or worsening fatigue over the next several days.    You have received COVID testing today either for positive exposure, concerning symptoms that could be related to COVID infection, screening purposes, or re-testing after confirmed positive.  Your test obtained today checks for active viral infection in the last 1-2 weeks. If your test is negative now, you can still test positive later. So, if you do develop symptoms you should either get re-tested and/or isolate x 5 days and then strict mask use x 5 days (unvaccinated) or mask use x 10 days (vaccinated). Please follow CDC guidelines.  While Rapid antigen tests come back in 15-20 minutes, send out PCR/molecular test results typically come back within 1-3 days. In the mean time, if you are symptomatic, assume this could be a positive test and treat/monitor yourself as if you do have COVID.   We will call with test results if positive. Please download the MyChart app and set up a profile to access test results.   If symptomatic, go home and rest. Push fluids. Take Tylenol as needed for discomfort. Gargle warm salt water. Throat lozenges. Take Mucinex DM or Robitussin for cough. Humidifier in bedroom to ease coughing. Warm showers. Also review the COVID handout for more information.  COVID-19 INFECTION: The incubation period of COVID-19 is approximately 14 days after exposure, with most symptoms developing in roughly 4-5 days. Symptoms may range in severity from mild to critically  severe. Roughly 80% of those infected will have mild symptoms. People of any age may become infected with COVID-19 and have the ability to transmit the virus. The most common symptoms include: fever, fatigue, cough, body aches, headaches, sore throat, nasal congestion, shortness of breath, nausea, vomiting, diarrhea, changes in smell and/or taste.    COURSE OF ILLNESS Some patients may begin with mild disease which can progress quickly into critical symptoms. If your symptoms are worsening please call ahead to the Emergency Department and proceed there for further treatment. Recovery time appears to be roughly 1-2 weeks for mild symptoms and 3-6 weeks for severe disease.   GO IMMEDIATELY TO ER FOR FEVER YOU ARE UNABLE TO GET DOWN WITH TYLENOL, BREATHING PROBLEMS, CHEST PAIN, FATIGUE, LETHARGY, INABILITY TO EAT OR DRINK, ETC  QUARANTINE AND ISOLATION: To help decrease the spread of COVID-19 please remain isolated if you have COVID infection or are highly suspected to have COVID infection. This means -stay home and isolate to one room in the home if you live with others. Do not share a bed or bathroom with others while ill, sanitize and wipe down all countertops and keep common areas clean and disinfected. Stay home for 5 days. If you have no symptoms or your symptoms are resolving after 5 days, you can leave your house. Continue to wear a mask around others for 5 additional days. If you have been in close contact (within 6 feet) of someone diagnosed with COVID 19, you are advised to quarantine in your home for 14 days as symptoms can develop anywhere from 2-14 days after exposure to the virus. If  you develop symptoms, you  must isolate.  Most current guidelines for COVID after exposure -unvaccinated: isolate 5 days and strict mask use x 5 days. Test on day 5 is possible -vaccinated: wear mask x 10 days if symptoms do not develop -You do not necessarily need to be tested for COVID if you have + exposure  and  develop symptoms. Just isolate at home x10 days from symptom onset During this global pandemic, CDC advises to practice social distancing, try to stay at least 7ft away from others at all times. Wear a face covering. Wash and sanitize your hands regularly and avoid going anywhere that is not necessary.  KEEP IN MIND THAT THE COVID TEST IS NOT 100% ACCURATE AND YOU SHOULD STILL DO EVERYTHING TO PREVENT POTENTIAL SPREAD OF VIRUS TO OTHERS (WEAR MASK, WEAR GLOVES, WASH HANDS AND SANITIZE REGULARLY). IF INITIAL TEST IS NEGATIVE, THIS MAY NOT MEAN YOU ARE DEFINITELY NEGATIVE. MOST ACCURATE TESTING IS DONE 5-7 DAYS AFTER EXPOSURE.   It is not advised by CDC to get re-tested after receiving a positive COVID test since you can still test positive for weeks to months after you have already cleared the virus.   *If you have not been vaccinated for COVID, I strongly suggest you consider getting vaccinated as long as there are no contraindications.

## 2021-04-25 NOTE — ED Triage Notes (Signed)
Pt here with C/O nasal congestion, eye pain, facial pain since Wednesday. No known exposure, at home test was negative on Wednesday.

## 2021-04-26 LAB — SARS CORONAVIRUS 2 (TAT 6-24 HRS): SARS Coronavirus 2: NEGATIVE

## 2021-04-28 ENCOUNTER — Telehealth: Payer: Self-pay

## 2021-04-28 ENCOUNTER — Ambulatory Visit: Payer: Medicaid Other | Admitting: Family Medicine

## 2021-04-28 LAB — CULTURE, GROUP A STREP (THRC)

## 2021-04-28 NOTE — Telephone Encounter (Signed)
Copied from CRM [id number]. Topic: Appointment Scheduling - Scheduling Inquiry for Clinic >> Apr 28, 2021  2:25 PM Aretta Nip wrote: Reason for CRM: Pt was willing to do virtual appt, but has had cold, chills , fever, congestion, sore throat, went to UC and was told pink eye.  Did not address any other symptoms, Wants at least something called in for congestion, yellow/brown fleam. Call back to advise [phone]

## 2021-04-29 ENCOUNTER — Ambulatory Visit: Payer: Medicaid Other | Admitting: Family Medicine

## 2021-11-26 ENCOUNTER — Encounter: Payer: Self-pay | Admitting: Family Medicine

## 2021-11-26 ENCOUNTER — Ambulatory Visit (INDEPENDENT_AMBULATORY_CARE_PROVIDER_SITE_OTHER): Payer: Self-pay | Admitting: Family Medicine

## 2021-11-26 VITALS — BP 134/80 | HR 76 | Ht 62.0 in | Wt 204.0 lb

## 2021-11-26 DIAGNOSIS — E782 Mixed hyperlipidemia: Secondary | ICD-10-CM

## 2021-11-26 NOTE — Patient Instructions (Signed)
GUIDELINES FOR  ?LOW-CHOLESTEROL, LOW-TRIGLYCERIDE DIETS  ?  ?FOODS TO USE  ? ?MEATS, FISH Choose lean meats (chicken, turkey, veal, and non-fatty cuts of beef with excess fat trimmed; one serving = 3 oz of cooked meat). Also, fresh or frozen fish, canned fish packed in water, and shellfish (lobster, crabs, shrimp, and oysters). Limit use to no more than one serving of one of these per week. Shellfish are high in cholesterol but low in saturated fat and should be used sparingly. Meats and fish should be broiled (pan or oven) or baked on a rack.  ?EGGS Egg substitutes and egg whites (use freely). Egg yolks (limit two per week).  ?FRUITS Eat three servings of fresh fruit per day (1 serving = ? cup). Be sure to have at least one citrus fruit daily. Frozen and canned fruit with no sugar or syrup added may be used.  ?VEGETABLES Most vegetables are not limited (see next page). One dark-green (string beans, escarole) or one deep yellow (squash) vegetable is recommended daily. Cauliflower, broccoli, and celery, as well as potato skins, are recommended for their fiber content. (Fiber is associated with cholesterol reduction) It is preferable to steam vegetables, but they may be boiled, strained, or braised with polyunsaturated vegetable oil (see below).  ?BEANS Dried peas or beans (1 serving = ? cup) may be used as a bread substitute.  ?NUTS Almonds, walnuts, and peanuts may be used sparingly  ?(1 serving = 1 Tablespoonful). Use pumpkin, sesame, or sunflower seeds.  ?BREADS, GRAINS One roll or one slice of whole grain or enriched bread may be used, or three soda crackers or four pieces of melba toast as a substitute. Spaghetti, rice or noodles (? cup) or ? large ear of corn may be used as a bread substitute. In preparing these foods do not use butter or shortening, use soft margarine. Also use egg and sugar substitutes.  Choose high fiber grains, such as oats and whole wheat.  ?CEREALS Use ? cup of hot cereal or ? cup of  cold cereal per day. Add a sugar substitute if desired, with 99% fat free or skim milk.  ?MILK PRODUCTS Always use 99% fat free or skim milk, dairy products such as low fat cheeses (farmer's uncreamed diet cottage), low-fat yogurt, and powdered skim milk.  ?FATS, OILS Use soft (not stick) margarine; vegetable oils that are high in polyunsaturated fats (such as safflower, sunflower, soybean, corn, and cottonseed). Always refrigerate meat drippings to harden the fat and remove it before preparing gravies  ?DESSERTS, SNACKS Limit to two servings per day; substitute each serving for a bread/cereal serving: ice milk, water sherbet (1/4 cup); unflavored gelatin or gelatin flavored with sugar substitute (1/3 cup); pudding prepared with skim milk (1/2 cup); egg white souffl?s; unbuttered popcorn (1 ? cups). Substitute carob for chocolate.  ?BEVERAGES Fresh fruit juices (limit 4 oz per day); black coffee, plain or herbal teas; soft drinks with sugar substitutes; club soda, preferably salt-free; cocoa made with skim milk or nonfat dried milk and water (sugar substitute added if desired); clear broth. Alcohol: limit two servings per day (see second page).  ?MISCELLANEOUS ? You may use the following freely: vinegar, spices, herbs, nonfat bouillon, mustard, Worcestershire sauce, soy sauce, flavoring essence.  ? ? ? ? ? ? ? ? ? ? ? ? ? ? ? ? ?GUIDELINES FOR  ?LOW-CHOLESTEROL, LOW TRIGLYCERIDE DIETS  ?  ?FOODS TO AVOID  ? ?MEATS, FISH Marbled beef, pork, bacon, sausage, and other pork products; fatty   fowl (duck, goose); skin and fat of turkey and chicken; processed meats; luncheon meats (salami, bologna); frankfurters and fast-food hamburgers (theyre loaded with fat); organ meats (kidneys, liver); canned fish packed in oil.  ?EGGS Limit egg yolks to two per week.   ?FRUITS Coconuts (rich in saturated fats).  ?VEGETABLES Avoid avocados. Starchy vegetables (potatoes, corn, lima beans, dried peas, beans) may be used only if  substitutes for a serving of bread or cereal. (Baked potato skin, however, is desirable for its fiber content.  ?BEANS Commercial baked beans with sugar and/or pork added.  ?NUTS Avoid nuts.  Limit peanuts and walnuts to one tablespoonful per day.  ?BREADS, GRAINS Any baked goods with shortening and/or sugar. Commercial mixes with dried eggs and whole milk. Avoid sweet rolls, doughnuts, breakfast pastries (Danish), and sweetened packaged cereals (the added sugar converts readily to triglycerides).  ?MILK PRODUCTS Whole milk and whole-milk packaged goods; cream; ice cream; whole-milk puddings, yogurt, or cheeses; nondairy cream substitutes.  ?FATS, OILS Butter, lard, animal fats, bacon drippings, gravies, cream sauces as well as palm and coconut oils. All these are high in saturated fats. Examine labels on cholesterol free products for hydrogenated fats. (These are oils that have been hardened into solids and in the process have become saturated.)  ?DESSERTS, SNACKS Fried snack foods like potato chips; chocolate; candies in general; jams, jellies, syrups; whole- milk puddings; ice cream and milk sherbets; hydrogenated peanut butter.  ?BEVERAGES Sugared fruit juices and soft drinks; cocoa made with whole milk and/or sugar. When using alcohol (1 oz liquor, 5 oz beer, or 2 ? oz dry table wine per serving), one serving must be substituted for one bread or cereal serving (limit, two servings of alcohol per day).  ? SPECIAL NOTES  ?  Remember that even non-limited foods should be used in moderation. ?While on a cholesterol-lowering diet, be sure to avoid animal fats and marbled meats. ?3. While on a triglyceride-lowering diet, be sure to avoid sweets and to control the amount of carbohydrates you eat (starchy foods such as flour, bread, potatoes).While on a tri-glyceride-lowering diet, be sure to avoid sweets ?Buy a good low-fat cookbook, such as the one published by the American Heart Association. ?Consult your physician  if you have any questions.  ? ? ? ? ? ? ? ? ? ? ? ? ? ?[facility] Low Glycemic Diet Plan ? ? ?Low Glycemic Foods (20-49) Moderate Glycemic Foods (50-69) High Glycemic Foods (70-100)  ?    ?Breakfast Creals Breakfast Cereals Breakfast Cereals  ?All Bran All-Bran Fruit'n Oats  ? Bran Buds Bran Chex  ? Cheerios Corn chex  ?  ?Fiber One Oatmeal (not instant)  ? Just Right Mini-Wheats  ? Corn Flakes Cream of Wheat  ?  ?Oat Bran Special K Swiss Muesli  ? Grape Nuts Grape Nut Flakes  ?  ?  Grits Nutri-Grain  ?  ?Fruits and fruit juice: Fruits Puffed Rice Puffed Wheat  ?  ?(Limit to 1-2 Servings per day) Banana (under-ride) Dates  ? Rice Chex Rice Krispies  ?  ?Apples Apricots (fresh/dried)  ? Figs Grapes  ? Shredded Wheat Team  ?  ?Blackberries Blueberries  ? Kiwi Mango  ? Total   ?  ?Cherries Cranberries  ? Oranges Raisins  ?   ?Peaches Pears  ?  Fruits  ?Plums Prunes  ? Fruit Juices Pineapple Watermelon  ?  ?Grapefruit Raspberries  ? Cranberry Juice Orange Juice  ? Banana (over-ripe)   ?  ?Strawberries Tangerines  ?    ?  Apple Juice Grapefruit Juice  ? Beans and Legumes Beverages  ?Tomato Juice   ? [place]-type baked beans Sodas, sweet tea, pineapple juice  ? Canned pinto, kidney, or navy beans   ?Beans and Legumes (fresh-cooked) Green peas Vegetables  ?Black-eyed peas Butter Beans  ?  Potato, baked, boiled, fried, mashed  ?Chick peas Lentils  ? Vegetables French fries  ?Green beans Lima beans  ? Beets Carrots  ? Canned or frozen corn  ?Kidney beans Navy beans  ? Sweet potato Yam  ? Parsnips  ?Pinto beans Snow peas  ? Corn on the cob Winter squash  ?    ?Non-starchy vegetables Grains Breads  ?Asparagus, avocado, broccoli, cabbage Cornmeal Rice, brown  ? Most breads (white and whole grain)  ?cauliflower, celery, cucumber, greens Rice, white Couscous  ? Bagels Bread sticks  ?  ?lettuce, mushrooms, peppers, tomatoes  Bread stuffing Kaiser roll  ?  ?okra, onions, spinach, summer squash Pasta Dinner rolls  ? Macaroni  Pizza, cheese  ?   ?Grains Ravioli, meat filled Spaghetti, white  ? Grains  ?Barley Bulgur  ?  Rice, instant Tapioca, with milk  ?  ?Rye Wild rice  ? Nuts   ? Cashews Macadamia  ? Candy and most cookies  ?Nuts and o

## 2021-11-26 NOTE — Progress Notes (Signed)
? ? ?Date:  11/26/2021  ? ?Name:  Brittany Patton   DOB:  [birth year]   MRN:  [id number] ? ? ?Chief Complaint: Annual Exam and Hyperlipidemia (Not been on med) ? ?Hyperlipidemia ?This is a chronic problem. The current episode started more than 1 year ago. The problem is controlled. Recent lipid tests were reviewed and are normal. She has no history of chronic renal disease, diabetes, hypothyroidism, liver disease, obesity or nephrotic syndrome. There are no known factors aggravating her hyperlipidemia. Pertinent negatives include no chest pain, focal sensory loss, focal weakness, leg pain, myalgias or shortness of breath. Current antihyperlipidemic treatment includes statins. The current treatment provides mild improvement of lipids. There are no compliance problems.   ? ?Lab Results  ?Component Value Date  ? NA 141 01/11/2018  ? K 4.4 01/11/2018  ? CO2 24 01/11/2018  ? GLUCOSE 103 (H) 01/11/2018  ? BUN 13 01/11/2018  ? CREATININE 0.80 01/11/2018  ? CALCIUM 9.6 01/11/2018  ? GFRNONAA 83 01/11/2018  ? ?Lab Results  ?Component Value Date  ? CHOL 219 (H) 10/28/2020  ? HDL 49 10/28/2020  ? LDLCALC 128 (H) 10/28/2020  ? TRIG 235 (H) 10/28/2020  ? CHOLHDL 4.3 01/11/2018  ? ?Lab Results  ?Component Value Date  ? TSH 1.140 03/08/2018  ? ?No results found for: HGBA1C ?Lab Results  ?Component Value Date  ? WBC 9.5 09/12/2015  ? HGB 13.8 03/08/2018  ? HCT 42.3 09/12/2015  ? MCV 82 09/12/2015  ? PLT 426 (H) 09/12/2015  ? ?Lab Results  ?Component Value Date  ? ALT 16 10/28/2020  ? AST 13 10/28/2020  ? ALKPHOS 107 10/28/2020  ? BILITOT 0.9 10/28/2020  ? ?No results found for: 25OHVITD2, [place], VD25OH  ? ?Review of Systems  ?Constitutional: Negative.  Negative for chills, fatigue, fever and unexpected weight change.  ?HENT:  Negative for congestion, ear discharge, ear pain, rhinorrhea, sinus pressure, sneezing and sore throat.   ?Respiratory:  Negative for cough, shortness of breath, wheezing and stridor.   ?Cardiovascular:   Negative for chest pain.  ?Gastrointestinal:  Negative for abdominal pain, blood in stool, constipation, diarrhea and nausea.  ?Genitourinary:  Negative for dysuria, flank pain, frequency, hematuria, urgency and vaginal discharge.  ?Musculoskeletal:  Negative for arthralgias, back pain and myalgias.  ?Skin:  Negative for rash.  ?Neurological:  Negative for dizziness, focal weakness, weakness and headaches.  ?Hematological:  Negative for adenopathy. Does not bruise/bleed easily.  ?Psychiatric/Behavioral:  Negative for dysphoric mood. The patient is not nervous/anxious.   ? ?Patient Active Problem List  ? Diagnosis Date Noted  ? Hyperlipidemia 04/13/2016  ? Esophageal reflux 04/13/2016  ? ? ?Allergies  ?Allergen Reactions  ? Statins Other (See Comments)  ?  Leg cramps  ? Sulfa Antibiotics Hives  ? ? ?Past Surgical History:  ?Procedure Laterality Date  ? CESAREAN SECTION    ? x 2  ? CHOLECYSTECTOMY, LAPAROSCOPIC  2015  ? COLONOSCOPY  2013  ? cleared for 10 yrs  ? TONSILLECTOMY    ? ? ?Social History  ? ?Tobacco Use  ? Smoking status: Former  ?  Types: Cigarettes  ?  Quit date: 07/06/1996  ?  Years since quitting: 25.4  ? Smokeless tobacco: Never  ?Vaping Use  ? Vaping Use: Never used  ?Substance Use Topics  ? Alcohol use: Yes  ?  Alcohol/week: 0.0 standard drinks  ?  Comment: rarely  ? Drug use: No  ? ? ? ?Medication list has been reviewed and updated. ? ?  Current Meds  ?Medication Sig  ? famotidine (PEPCID) 20 MG tablet Take 20 mg by mouth daily as needed for heartburn or indigestion.  ? ? ? ?  11/26/2021  ? 11:07 AM 08/13/2020  ?  1:59 PM 02/21/2020  ? 10:10 AM  ?GAD 7 : Generalized Anxiety Score  ?Nervous, Anxious, on Edge 0 0 0  ?Control/stop worrying 0 0 0  ?Worry too much - different things 0 0 0  ?Trouble relaxing 0 0 0  ?Restless 0 0 0  ?Easily annoyed or irritable 0 0 0  ?Afraid - awful might happen 0 0 0  ?Total GAD 7 Score 0 0 0  ?Anxiety Difficulty Not difficult at all Not difficult at all   ? ? ? ?  11/26/2021  ?  11:07 AM  ?Depression screen PHQ 2/9  ?Decreased Interest 0  ?Down, Depressed, Hopeless 0  ?PHQ - 2 Score 0  ?Altered sleeping 0  ?Tired, decreased energy 0  ?Change in appetite 0  ?Feeling bad or failure about yourself  0  ?Trouble concentrating 0  ?Moving slowly or fidgety/restless 0  ?Suicidal thoughts 0  ?PHQ-9 Score 0  ?Difficult doing work/chores Not difficult at all  ? ? ?BP Readings from Last 3 Encounters:  ?11/26/21 134/80  ?04/25/21 137/68  ?08/13/20 134/80  ? ? ?Physical Exam ?Vitals and nursing note reviewed.  ?Constitutional:   ?   Appearance: She is well-developed.  ?HENT:  ?   Head: Normocephalic.  ?   Right Ear: External ear normal.  ?   Left Ear: External ear normal.  ?Eyes:  ?   General: Lids are everted, no foreign bodies appreciated. No scleral icterus.    ?   Left eye: No foreign body or hordeolum.  ?   Conjunctiva/sclera: Conjunctivae normal.  ?   Right eye: Right conjunctiva is not injected.  ?   Left eye: Left conjunctiva is not injected.  ?   Pupils: Pupils are equal, round, and reactive to light.  ?Neck:  ?   Thyroid: No thyromegaly.  ?   Vascular: No JVD.  ?   Trachea: No tracheal deviation.  ?Cardiovascular:  ?   Rate and Rhythm: Normal rate and regular rhythm.  ?   Heart sounds: Normal heart sounds. No murmur heard. ?  No friction rub. No gallop.  ?Pulmonary:  ?   Effort: Pulmonary effort is normal. No respiratory distress.  ?   Breath sounds: Normal breath sounds. No wheezing or rales.  ?Abdominal:  ?   General: Bowel sounds are normal.  ?   Palpations: Abdomen is soft. There is no mass.  ?   Tenderness: There is no abdominal tenderness. There is no guarding or rebound.  ?Musculoskeletal:     ?   General: No tenderness. Normal range of motion.  ?   Cervical back: Normal range of motion and neck supple.  ?Lymphadenopathy:  ?   Cervical: No cervical adenopathy.  ?Skin: ?   General: Skin is warm.  ?   Findings: No rash.  ?Neurological:  ?   Mental Status: She is alert and oriented to  person, place, and time.  ?   Cranial Nerves: No cranial nerve deficit.  ?   Deep Tendon Reflexes: Reflexes normal.  ?Psychiatric:     ?   Mood and Affect: Mood is not anxious or depressed.  ? ? ?Wt Readings from Last 3 Encounters:  ?11/26/21 204 lb (92.5 kg)  ?04/25/21 210 lb (95.3 kg)  ?08/13/20  211 lb 3.2 oz (95.8 kg)  ? ? ?BP 134/80   Pulse 76   Ht 5\' 2"  (1.575 m)   Wt 204 lb (92.5 kg)   BMI 37.31 kg/m?  ? ?Assessment and Plan: ? ?1. Mixed hyperlipidemia ?Chronic.  Controlled.  Stable.  Patient has had elevated LDLs in the past but has been unable to tolerate statins due to myalgias and financially afford Zetia.  Patient has been given a low-cholesterol low triglyceride diet to do her best encouraged to lose weight and exercise when able. ?- Direct LDL  ? ?Patient had form filled out for foster care maintenance. ?

## 2021-11-27 LAB — LDL CHOLESTEROL, DIRECT: LDL Direct: 186 mg/dL — ABNORMAL HIGH (ref 0–99)

## 2021-11-30 ENCOUNTER — Other Ambulatory Visit: Payer: Self-pay

## 2021-11-30 ENCOUNTER — Telehealth: Payer: Self-pay

## 2021-11-30 DIAGNOSIS — E782 Mixed hyperlipidemia: Secondary | ICD-10-CM

## 2021-11-30 MED ORDER — EZETIMIBE 10 MG PO TABS: 10.0000 mg | ORAL_TABLET | Freq: Every day | ORAL | 0 refills | Status: DC

## 2021-11-30 NOTE — Telephone Encounter (Signed)
Spoke with patient about her recent labs. She said she hear my VM but said she is not "on a low cholesterol diet." ?Spoke with Dr Yetta Barre. She wants her to start back on Zetia. Sent this in to patients pharmacy and she verbalized understanding. ?

## 2022-04-16 ENCOUNTER — Telehealth: Payer: Self-pay

## 2022-04-16 NOTE — Telephone Encounter (Signed)
Called to inform flu shots available 

## 2022-05-12 ENCOUNTER — Other Ambulatory Visit: Payer: Self-pay

## 2022-05-12 ENCOUNTER — Telehealth: Payer: Self-pay | Admitting: Family Medicine

## 2022-05-12 DIAGNOSIS — E782 Mixed hyperlipidemia: Secondary | ICD-10-CM

## 2022-05-12 MED ORDER — EZETIMIBE 10 MG PO TABS: 10.0000 mg | ORAL_TABLET | Freq: Every day | ORAL | 0 refills | Status: DC

## 2022-05-12 NOTE — Telephone Encounter (Signed)
Copied from [place] [id number]. Topic: General - Call Back - No Documentation >> May 12, 2022 10:31 AM Sabas Sous wrote: Reason for CRM: Pt called requesting to speak to Baxter Flattery, did not disclose why when asked.

## 2022-05-24 ENCOUNTER — Ambulatory Visit (INDEPENDENT_AMBULATORY_CARE_PROVIDER_SITE_OTHER): Payer: Self-pay | Admitting: Family Medicine

## 2022-05-24 ENCOUNTER — Encounter: Payer: Self-pay | Admitting: Family Medicine

## 2022-05-24 VITALS — BP 120/80 | HR 80 | Temp 98.6°F | Ht 62.0 in | Wt 209.0 lb

## 2022-05-24 DIAGNOSIS — Z23 Encounter for immunization: Secondary | ICD-10-CM

## 2022-05-24 DIAGNOSIS — J019 Acute sinusitis, unspecified: Secondary | ICD-10-CM

## 2022-05-24 DIAGNOSIS — E782 Mixed hyperlipidemia: Secondary | ICD-10-CM

## 2022-05-24 MED ORDER — AMOXICILLIN 500 MG PO CAPS: 500.0000 mg | ORAL_CAPSULE | Freq: Three times a day (TID) | ORAL | 0 refills | Status: AC

## 2022-05-24 MED ORDER — EZETIMIBE 10 MG PO TABS: 10.0000 mg | ORAL_TABLET | Freq: Every day | ORAL | 0 refills | Status: DC

## 2022-05-24 NOTE — Progress Notes (Signed)
Date:  05/24/2022   Name:  Brittany Patton   DOB:  03/10/1961   MRN:  [id number]   Chief Complaint: Adenopathy (R) side of neck- sore feeling and "can feel a knot"), Hyperlipidemia, and Flu Vaccine  Cough This is a chronic problem. The current episode started 1 to 4 weeks ago. The problem has been waxing and waning. The problem occurs constantly. The cough is Productive of sputum. Associated symptoms include chills, nasal congestion and rhinorrhea. Pertinent negatives include no chest pain, ear congestion, ear pain, fever, headaches, hemoptysis, postnasal drip, sore throat, shortness of breath, sweats or wheezing. She has tried nothing for the symptoms.  Sinusitis There has been no fever. Associated symptoms include chills, congestion and coughing. Pertinent negatives include no ear pain, headaches, shortness of breath, sinus pressure, sneezing or sore throat.    Lab Results  Component Value Date   NA 141 01/11/2018   K 4.4 01/11/2018   CO2 24 01/11/2018   GLUCOSE 103 (H) 01/11/2018   BUN 13 01/11/2018   CREATININE 0.80 01/11/2018   CALCIUM 9.6 01/11/2018   GFRNONAA 83 01/11/2018   Lab Results  Component Value Date   CHOL 219 (H) 10/28/2020   HDL 49 10/28/2020   LDLCALC 128 (H) 10/28/2020   LDLDIRECT 186 (H) 11/26/2021   TRIG 235 (H) 10/28/2020   CHOLHDL 4.3 01/11/2018   Lab Results  Component Value Date   TSH 1.140 03/08/2018   No results found for: "HGBA1C" Lab Results  Component Value Date   WBC 9.5 09/12/2015   HGB 13.8 03/08/2018   HCT 42.3 09/12/2015   MCV 82 09/12/2015   PLT 426 (H) 09/12/2015   Lab Results  Component Value Date   ALT 16 10/28/2020   AST 13 10/28/2020   ALKPHOS 107 10/28/2020   BILITOT 0.9 10/28/2020   No results found for: "25OHVITD2", "25OHVITD3", "VD25OH"   Review of Systems  Constitutional:  Positive for chills. Negative for fever.  HENT:  Positive for congestion and rhinorrhea. Negative for ear pain, postnasal drip, sinus  pressure, sneezing and sore throat.   Respiratory:  Positive for cough. Negative for hemoptysis, shortness of breath and wheezing.   Cardiovascular:  Negative for chest pain.  Neurological:  Negative for headaches.    Patient Active Problem List   Diagnosis Date Noted   Hyperlipidemia 04/13/2016   Esophageal reflux 04/13/2016    Allergies  Allergen Reactions   Statins Other (See Comments)    Leg cramps   Sulfa Antibiotics Hives    Past Surgical History:  Procedure Laterality Date   CESAREAN SECTION     x 2   CHOLECYSTECTOMY, LAPAROSCOPIC  2015   COLONOSCOPY  2013   cleared for 10 yrs   TONSILLECTOMY      Social History   Tobacco Use   Smoking status: Former    Types: Cigarettes    Quit date: 07/06/1996    Years since quitting: 25.8   Smokeless tobacco: Never  Vaping Use   Vaping Use: Never used  Substance Use Topics   Alcohol use: Yes    Alcohol/week: 0.0 standard drinks of alcohol    Comment: rarely   Drug use: No     Medication list has been reviewed and updated.  No outpatient medications have been marked as taking for the 05/24/22 encounter (Office Visit) with Duanne Limerick, MD.       05/24/2022   10:40 AM 11/26/2021   11:07 AM 08/13/2020    1:59  PM 02/21/2020   10:10 AM  GAD 7 : Generalized Anxiety Score  Nervous, Anxious, on Edge 0 0 0 0  Control/stop worrying 0 0 0 0  Worry too much - different things 0 0 0 0  Trouble relaxing 0 0 0 0  Restless 0 0 0 0  Easily annoyed or irritable 0 0 0 0  Afraid - awful might happen 0 0 0 0  Total GAD 7 Score 0 0 0 0  Anxiety Difficulty Not difficult at all Not difficult at all Not difficult at all        05/24/2022   10:40 AM 11/26/2021   11:07 AM 08/13/2020    1:59 PM  Depression screen PHQ 2/9  Decreased Interest 0 0 0  Down, Depressed, Hopeless 0 0 0  PHQ - 2 Score 0 0 0  Altered sleeping 0 0 0  Tired, decreased energy 0 0 0  Change in appetite 0 0 0  Feeling bad or failure about yourself  0 0 0   Trouble concentrating 0 0 0  Moving slowly or fidgety/restless 0 0 0  Suicidal thoughts 0 0 0  PHQ-9 Score 0 0 0  Difficult doing work/chores Not difficult at all Not difficult at all Not difficult at all    BP Readings from Last 3 Encounters:  05/24/22 120/80  11/26/21 134/80  04/25/21 137/68    Physical Exam Vitals and nursing note reviewed. Exam conducted with a chaperone present.  Constitutional:      General: She is not in acute distress.    Appearance: She is not diaphoretic.  HENT:     Head: Normocephalic and atraumatic.     Right Ear: Tympanic membrane and external ear normal.     Left Ear: Tympanic membrane and external ear normal.     Nose: Congestion present. No rhinorrhea.     Mouth/Throat:     Mouth: Mucous membranes are moist.  Eyes:     General:        Right eye: No discharge.        Left eye: No discharge.     Conjunctiva/sclera: Conjunctivae normal.     Pupils: Pupils are equal, round, and reactive to light.  Neck:     Thyroid: No thyromegaly.     Vascular: No JVD.  Cardiovascular:     Rate and Rhythm: Normal rate and regular rhythm.     Heart sounds: Normal heart sounds. No murmur heard.    No friction rub. No gallop.  Pulmonary:     Effort: Pulmonary effort is normal.     Breath sounds: Normal breath sounds. No wheezing, rhonchi or rales.  Abdominal:     General: Bowel sounds are normal.     Palpations: Abdomen is soft. There is no mass.     Tenderness: There is no abdominal tenderness. There is no guarding.  Musculoskeletal:        General: Normal range of motion.     Cervical back: Normal range of motion and neck supple.  Lymphadenopathy:     Cervical: No cervical adenopathy.  Skin:    General: Skin is warm and dry.  Neurological:     Mental Status: She is alert.     Deep Tendon Reflexes: Reflexes are normal and symmetric.    Wt Readings from Last 3 Encounters:  05/24/22 209 lb (94.8 kg)  11/26/21 204 lb (92.5 kg)  04/25/21 210 lb  (95.3 kg)    BP 120/80   Pulse  80   Temp 98.6 F (37 C) (Oral)   Ht 5\' 2"  (1.575 m)   Wt 209 lb (94.8 kg)   BMI 38.23 kg/m   Assessment and Plan:  1. Acute non-recurrent sinusitis, unspecified location New onset.  Persistent.  Exam and history is likely that this has been a problem along the past several weeks consistent with a residual sinusitis.  We will treat with amoxicillin 500 mg 3 times a day for 10 days. - amoxicillin (AMOXIL) 500 MG capsule; Take 1 capsule (500 mg total) by mouth 3 (three) times daily for 10 days.  Dispense: 30 capsule; Refill: 0  2. Mixed hyperlipidemia We will not be doing this today that patient has not been taking medication we will refill 30 days and have her come back in 3 weeks for examination and labs consistent with hyperlipidemia. - ezetimibe (ZETIA) 10 MG tablet; Take 1 tablet (10 mg total) by mouth daily.  Dispense: 30 tablet; Refill: 0  3. Need for immunization against influenza Discussed and administered - Flu Vaccine QUAD 106mo+IM (Fluarix, Fluzone & Alfiuria Quad PF)    5mo, MD

## 2022-06-03 ENCOUNTER — Ambulatory Visit: Payer: Medicaid Other | Admitting: Family Medicine

## 2022-06-11 ENCOUNTER — Other Ambulatory Visit: Payer: Self-pay

## 2022-06-11 DIAGNOSIS — E782 Mixed hyperlipidemia: Secondary | ICD-10-CM

## 2022-06-12 LAB — LIPID PANEL WITH LDL/HDL RATIO
Cholesterol, Total: 248 mg/dL — ABNORMAL HIGH (ref 100–199)
HDL: 47 mg/dL (ref >39)
LDL Chol Calc (NIH): 151 mg/dL — ABNORMAL HIGH (ref 0–99)
LDL/HDL Ratio: 3.2 ratio (ref 0.0–3.2)
Triglycerides: 275 mg/dL — ABNORMAL HIGH (ref 0–149)
VLDL Cholesterol Cal: 50 mg/dL — ABNORMAL HIGH (ref 5–40)

## 2022-06-12 LAB — COMPREHENSIVE METABOLIC PANEL
ALT: 17 IU/L (ref 0–32)
AST: 20 IU/L (ref 0–40)
Albumin/Globulin Ratio: 1.7 (ref 1.2–2.2)
Albumin: 4.4 g/dL (ref 3.9–4.9)
Alkaline Phosphatase: 100 IU/L (ref 44–121)
BUN/Creatinine Ratio: 17 (ref 12–28)
BUN: 13 mg/dL (ref 8–27)
Bilirubin Total: 0.7 mg/dL (ref 0.0–1.2)
CO2: 23 mmol/L (ref 20–29)
Calcium: 9.9 mg/dL (ref 8.7–10.3)
Chloride: 101 mmol/L (ref 96–106)
Creatinine, Ser: 0.76 mg/dL (ref 0.57–1.00)
Globulin, Total: 2.6 g/dL (ref 1.5–4.5)
Glucose: 98 mg/dL (ref 70–99)
Potassium: 4.5 mmol/L (ref 3.5–5.2)
Sodium: 139 mmol/L (ref 134–144)
Total Protein: 7 g/dL (ref 6.0–8.5)
eGFR: 89 mL/min/{1.73_m2} (ref >59)

## 2022-06-14 ENCOUNTER — Other Ambulatory Visit: Payer: Self-pay

## 2022-06-14 DIAGNOSIS — E782 Mixed hyperlipidemia: Secondary | ICD-10-CM

## 2022-06-14 MED ORDER — ATORVASTATIN CALCIUM 10 MG PO TABS: 10.0000 mg | ORAL_TABLET | Freq: Every day | ORAL | 0 refills | Status: DC

## 2022-06-14 NOTE — Telephone Encounter (Signed)
Pt called saying the cholesterol mediation that was called in is over 100.0 and she cannot afford to pay that much.  CB [phone]

## 2022-06-14 NOTE — Progress Notes (Signed)
Pt informed and verbalized understanding.  KP 

## 2022-06-17 ENCOUNTER — Other Ambulatory Visit: Payer: Self-pay | Admitting: Family Medicine

## 2022-06-17 DIAGNOSIS — E782 Mixed hyperlipidemia: Secondary | ICD-10-CM

## 2022-06-17 MED ORDER — ATORVASTATIN CALCIUM 10 MG PO TABS: 10.0000 mg | ORAL_TABLET | Freq: Every day | ORAL | 3 refills | Status: DC

## 2022-06-17 NOTE — Telephone Encounter (Signed)
The patient called in stating she has been having issues with her current pharmacy [facility] and wants to switch to [facility]. She had a prescription for atorvastatin (LIPITOR) 10 MG tablet called in two days ago but she never picked it up at [facility]. She also had [facility] try to call to get it switched over and no one answered the phone. Can the prescription please be called into her new pharmacy at  [facility], [place] Phone:  [phone]  Fax:  [phone]      Please assist patient further

## 2022-06-17 NOTE — Telephone Encounter (Signed)
Requested Prescriptions  Pending Prescriptions Disp Refills  . atorvastatin (LIPITOR) 10 MG tablet 90 tablet 3    Sig: Take 1 tablet (10 mg total) by mouth daily.     Cardiovascular:  Antilipid - Statins Failed - 06/17/2022  2:36 PM      Failed - Lipid Panel in normal range within the last 12 months    Cholesterol, Total  Date Value Ref Range Status  06/11/2022 248 (H) 100 - 199 mg/dL Final   LDL Chol Calc (NIH)  Date Value Ref Range Status  06/11/2022 151 (H) 0 - 99 mg/dL Final   LDL Direct  Date Value Ref Range Status  11/26/2021 186 (H) 0 - 99 mg/dL Final   HDL  Date Value Ref Range Status  06/11/2022 47 >39 mg/dL Final   Triglycerides  Date Value Ref Range Status  06/11/2022 275 (H) 0 - 149 mg/dL Final         Passed - Patient is not pregnant      Passed - Valid encounter within last 12 months    Recent Outpatient Visits          3 weeks ago Acute non-recurrent sinusitis, unspecified location   [facility] at [place], [place], MD   6 months ago Mixed hyperlipidemia   [place] at [place], Deanna C, MD   1 year ago Acute non-recurrent maxillary sinusitis   [place] [place] [facility] at [place], Deanna C, MD   2 years ago Physical exam, pre-employment   [place] [facility] at [place], Deanna C, MD   4 years ago Fatigue, unspecified type   [facility] at [place], [place], MD      Future Appointments            In 5 months Juline Patch, MD [facility] at [facility], [facility]

## 2022-06-20 ENCOUNTER — Ambulatory Visit
Admission: EM | Admit: 2022-06-20 | Discharge: 2022-06-20 | Disposition: A | Discharge status: Home / Self Care | Payer: Medicaid Other | Attending: Physician Assistant | Admitting: Physician Assistant

## 2022-06-20 DIAGNOSIS — J209 Acute bronchitis, unspecified: Secondary | ICD-10-CM

## 2022-06-20 DIAGNOSIS — R051 Acute cough: Secondary | ICD-10-CM

## 2022-06-20 MED ORDER — DOXYCYCLINE HYCLATE 100 MG PO CAPS: 100.0000 mg | ORAL_CAPSULE | Freq: Two times a day (BID) | ORAL | 0 refills | Status: DC

## 2022-06-20 MED ORDER — CHERATUSSIN AC 100-10 MG/5ML PO SOLN: ORAL | 0 refills | Status: DC

## 2022-06-20 NOTE — ED Provider Notes (Signed)
[facility]    CSN: [id number] Arrival date & time: 06/20/22  0931      History   Chief Complaint Chief Complaint  Patient presents with   Cough     HPI Brittany Patton is a 61 y.o. female presenting for 1 week history of cough and congestion.  Reports the cough is mostly dry but sometimes productive.  She says it feels like her chest is congested. He says the cough is worse at night and keeps her awake. She denies any known COVID exposure.  She has been vaccinated for COVID-19 x2 and boosted.  Patient denying any chest pain, breathing difficulty, nausea/vomiting/diarrhea.  She does report that a couple weeks ago she was sick with similar symptoms and then things got better before return and worsening of her symptoms over the past week.  She has tried over-the-counter cough medicine.  She is otherwise healthy with a history of GERD and hyperlipidemia.  No other complaints or concerns.  HPI  Past Medical History:  Diagnosis Date   Depression    GERD (gastroesophageal reflux disease)    Hyperlipidemia     Patient Active Problem List   Diagnosis Date Noted   Hyperlipidemia 04/13/2016   Esophageal reflux 04/13/2016    Past Surgical History:  Procedure Laterality Date   CESAREAN SECTION     x 2   CHOLECYSTECTOMY, LAPAROSCOPIC  2015   COLONOSCOPY  2013   cleared for 10 yrs   TONSILLECTOMY      OB History     Gravida  2   Para  2   Term  2   Preterm      AB      Living  2      SAB      IAB      Ectopic      Multiple      Live Births  2            Home Medications    Prior to Admission medications   Medication Sig Start Date End Date Taking? Authorizing Provider  atorvastatin (LIPITOR) 10 MG tablet Take 1 tablet (10 mg total) by mouth daily. 06/17/22  Yes Duanne Limerick, MD  doxycycline (VIBRAMYCIN) 100 MG capsule Take 1 capsule (100 mg total) by mouth 2 (two) times daily for 7 days. 06/20/22 06/27/22 Yes Shirlee Latch, PA-C   ezetimibe (ZETIA) 10 MG tablet Take 1 tablet (10 mg total) by mouth daily. 05/24/22  Yes Duanne Limerick, MD  famotidine (PEPCID) 20 MG tablet Take 20 mg by mouth daily as needed for heartburn or indigestion.   Yes [provider]  guaiFENesin-codeine [facility]) 100-10 MG/5ML syrup 5-10 ml po TID prn cough 06/20/22  Yes Shirlee Latch, PA-C    Family History Family History  Problem Relation Age of Onset   Diabetes Mother    Breast cancer Maternal Aunt 40   Hypertension Father    Heart disease Father    Glaucoma Father     Social History Social History   Tobacco Use   Smoking status: Former    Types: Cigarettes    Quit date: 07/06/1996    Years since quitting: 25.9   Smokeless tobacco: Never  Vaping Use   Vaping Use: Never used  Substance Use Topics   Alcohol use: Yes    Alcohol/week: 0.0 standard drinks of alcohol    Comment: rarely   Drug use: No     Allergies  Statins and Sulfa antibiotics   Review of Systems Review of Systems  Constitutional:  Positive for fatigue. Negative for chills, diaphoresis and fever.  HENT:  Positive for congestion, postnasal drip and rhinorrhea. Negative for ear pain, sinus pressure, sinus pain and sore throat.   Respiratory:  Positive for cough. Negative for shortness of breath and wheezing.   Cardiovascular:  Negative for chest pain.  Gastrointestinal:  Negative for abdominal pain, nausea and vomiting.  Musculoskeletal:  Negative for arthralgias and myalgias.  Skin:  Negative for rash.  Neurological:  Negative for weakness and headaches.  Hematological:  Negative for adenopathy.     Physical Exam Triage Vital Signs ED Triage Vitals  Enc Vitals Group     BP 04/25/21 1045 137/68     Pulse Rate 04/25/21 1045 88     Resp 04/25/21 1045 18     Temp 04/25/21 1045 98.7 F (37.1 C)     Temp Source 04/25/21 1045 Oral     SpO2 04/25/21 1045 96 %     Weight 04/25/21 1044 210 lb (95.3 kg)     Height 04/25/21 1044 5'  2" (1.575 m)     Head Circumference --      Peak Flow --      Pain Score 04/25/21 1044 0     Pain Loc --      Pain Edu? --      Excl. in GC? --    No data found.  Updated Vital Signs BP 137/84 (BP Location: Left Arm)   Pulse 80   Temp 99.9 F (37.7 C) (Oral)   Resp 18   Ht 5\' 2"  (1.575 m)   Wt 209 lb (94.8 kg)   SpO2 95%   BMI 38.23 kg/m      Physical Exam Vitals and nursing note reviewed.  Constitutional:      General: She is not in acute distress.    Appearance: Normal appearance. She is ill-appearing. She is not toxic-appearing.  HENT:     Head: Normocephalic and atraumatic.     Right Ear: Tympanic membrane, ear canal and external ear normal.     Left Ear: Tympanic membrane, ear canal and external ear normal.     Nose: Congestion present.     Mouth/Throat:     Mouth: Mucous membranes are moist.     Pharynx: Oropharynx is clear. No posterior oropharyngeal erythema.  Eyes:     General: No scleral icterus.       Right eye: No discharge.        Left eye: No discharge.     Conjunctiva/sclera:     Right eye: Right conjunctiva is not injected.     Left eye: Left conjunctiva is not injected.  Cardiovascular:     Rate and Rhythm: Normal rate and regular rhythm.     Heart sounds: Normal heart sounds.  Pulmonary:     Effort: Pulmonary effort is normal. No respiratory distress.     Breath sounds: Rhonchi (right upper lung field) present.  Musculoskeletal:     Cervical back: Neck supple.  Skin:    General: Skin is dry.  Neurological:     General: No focal deficit present.     Mental Status: She is alert. Mental status is at baseline.     Motor: No weakness.     Gait: Gait normal.  Psychiatric:        Mood and Affect: Mood normal.        Behavior: Behavior  normal.        Thought Content: Thought content normal.      UC Treatments / Results  Labs (all labs ordered are listed, but only abnormal results are displayed) Labs Reviewed - No data to  display   EKG   Radiology No results found.  Procedures Procedures (including critical care time)  Medications Ordered in UC Medications - No data to display  Initial Impression / Assessment and Plan / UC Course  I have reviewed the triage vital signs and the nursing notes.  Pertinent labs & imaging results that were available during my care of the patient were reviewed by me and considered in my medical decision making (see chart for details).  61 year old female presenting for fatigue, cough, nasal congestion, difficulty sleeping due to cough.  Patient is ill-appearing but not toxic.  Positive nasal congestion and few scattered rhonchi right upper lung field.   Advised patient her symptoms are likely viral but given the fact that her symptoms have worsened and she was feeling ill not too long before onset of current symptoms, I did print a prescription for doxycycline just in case she is starting to develop a secondary bacterial infection.  She also does have localized rhonchi of the right upper lung field which could be concerning for early pneumonia.  Supportive care encouraged.  I have sent Cheratussin to pharmacy.  Encourage plenty of rest and fluids.  Thoroughly reviewed return and ED precautions.   Final Clinical Impressions(s) / UC Diagnoses   Final diagnoses:  Acute bronchitis, unspecified organism  Acute cough     Discharge Instructions      -As we discussed, you most likely have viral bronchitis which can last a couple of weeks.  I sent something for the cough. - If you are not feeling better in a few days or you have a higher temperature, breathing difficulty, worsening cough, try the antibiotic in case you are developing a bacterial infection. - Return for any acute worsening of symptoms or go to ER.       ED Prescriptions     Medication Sig Dispense Auth. Provider   guaiFENesin-codeine [facility]) 100-10 MG/5ML syrup 5-10 ml po TID prn cough 120  mL Laurene Footman B, PA-C   doxycycline (VIBRAMYCIN) 100 MG capsule Take 1 capsule (100 mg total) by mouth 2 (two) times daily for 7 days. 14 capsule Danton Clap, PA-C      I have reviewed the PDMP during this encounter.     Danton Clap, PA-C 06/20/22 1023

## 2022-06-20 NOTE — Discharge Instructions (Signed)
-  As we discussed, you most likely have viral bronchitis which can last a couple of weeks.  I sent something for the cough. - If you are not feeling better in a few days or you have a higher temperature, breathing difficulty, worsening cough, try the antibiotic in case you are developing a bacterial infection. - Return for any acute worsening of symptoms or go to ER.

## 2022-06-20 NOTE — ED Triage Notes (Signed)
Pt c/o cough, body chills, fatigue x1week.  Pt states that she was sick 3 weeks ago and now symptoms have come back and have lasted for a week.

## 2022-06-22 ENCOUNTER — Telehealth: Payer: Self-pay | Admitting: Family Medicine

## 2022-06-22 ENCOUNTER — Telehealth: Payer: Self-pay

## 2022-06-22 NOTE — Telephone Encounter (Signed)
Called pt back, she wanted a different cough medicine other than what was prescribed in UC. I explained that the cough syrup she was given is the same [facility] prescribes. I also offered tessalon perles, but pt stated they don't work either. Therefore, I told her to check back in with [place] on Thursday, if no better, we will see her and do a chest xray

## 2022-06-22 NOTE — Telephone Encounter (Signed)
Copied from [place] [id number]. Topic: General - Other >> Jun 22, 2022  8:03 AM Chapman Fitch wrote: Reason for CRM: Pt asked for a call from [place] Flattery to discuss medication / please advise

## 2022-06-24 ENCOUNTER — Ambulatory Visit (INDEPENDENT_AMBULATORY_CARE_PROVIDER_SITE_OTHER): Payer: Self-pay | Admitting: Family Medicine

## 2022-06-24 ENCOUNTER — Ambulatory Visit
Admission: RE | Admit: 2022-06-24 | Discharge: 2022-06-24 | Disposition: A | Discharge status: Home / Self Care | Payer: Self-pay | Source: Ambulatory Visit | Attending: Family Medicine | Admitting: Family Medicine

## 2022-06-24 ENCOUNTER — Ambulatory Visit
Admission: RE | Admit: 2022-06-24 | Discharge: 2022-06-24 | Disposition: A | Discharge status: Home / Self Care | Payer: Self-pay | Attending: Family Medicine | Admitting: Family Medicine

## 2022-06-24 ENCOUNTER — Encounter: Payer: Self-pay | Admitting: Family Medicine

## 2022-06-24 VITALS — BP 128/60 | HR 92 | Temp 99.8°F | Ht 62.0 in | Wt 209.0 lb

## 2022-06-24 DIAGNOSIS — R051 Acute cough: Secondary | ICD-10-CM | POA: Insufficient documentation

## 2022-06-24 DIAGNOSIS — R509 Fever, unspecified: Secondary | ICD-10-CM | POA: Insufficient documentation

## 2022-06-24 DIAGNOSIS — J209 Acute bronchitis, unspecified: Secondary | ICD-10-CM

## 2022-06-24 LAB — POCT INFLUENZA A/B
Influenza A, POC: NEGATIVE
Influenza B, POC: NEGATIVE

## 2022-06-24 MED ORDER — ALBUTEROL SULFATE HFA 108 (90 BASE) MCG/ACT IN AERS: 2.0000 | INHALATION_SPRAY | Freq: Four times a day (QID) | RESPIRATORY_TRACT | 2 refills | Status: DC | PRN

## 2022-06-24 MED ORDER — LEVOFLOXACIN 500 MG PO TABS: 500.0000 mg | ORAL_TABLET | Freq: Every day | ORAL | 0 refills | Status: AC

## 2022-06-24 MED ORDER — CHERATUSSIN AC 100-10 MG/5ML PO SOLN: ORAL | 0 refills | Status: DC

## 2022-06-24 NOTE — Patient Instructions (Addendum)
Why follow it? Research shows. Those who follow the Mediterranean diet have a reduced risk of heart disease  The diet is associated with a reduced incidence of Parkinson's and Alzheimer's diseases People following the diet may have longer life expectancies and lower rates of chronic diseases  The Dietary Guidelines for Americans recommends the Mediterranean diet as an eating plan to promote health and prevent disease  What Is the Mediterranean Diet?  Healthy eating plan based on typical foods and recipes of Mediterranean-style cooking The diet is primarily a plant based diet; these foods should make up a majority of meals   Starches - Plant based foods should make up a majority of meals - They are an important sources of vitamins, minerals, energy, antioxidants, and fiber - Choose whole grains, foods high in fiber and minimally processed items  - Typical grain sources include wheat, oats, barley, corn, brown rice, bulgar, farro, millet, polenta, couscous  - Various types of beans include chickpeas, lentils, fava beans, black beans, white beans   Fruits  Veggies - Large quantities of antioxidant rich fruits & veggies; 6 or more servings  - Vegetables can be eaten raw or lightly drizzled with oil and cooked  - Vegetables common to the traditional Mediterranean Diet include: artichokes, arugula, beets, broccoli, brussel sprouts, cabbage, carrots, celery, collard greens, cucumbers, eggplant, kale, leeks, lemons, lettuce, mushrooms, okra, onions, peas, peppers, potatoes, pumpkin, radishes, rutabaga, shallots, spinach, sweet potatoes, turnips, zucchini - Fruits common to the Mediterranean Diet include: apples, apricots, avocados, cherries, clementines, dates, figs, grapefruits, grapes, melons, nectarines, oranges, peaches, pears, pomegranates, strawberries, tangerines  Fats - Replace butter and margarine with healthy oils, such as olive oil, canola oil, and tahini  - Limit nuts to no more than a  handful a day  - Nuts include walnuts, almonds, pecans, pistachios, pine nuts  - Limit or avoid candied, honey roasted or heavily salted nuts - Olives are central to the Mediterranean diet - can be eaten whole or used in a variety of dishes   Meats Protein - Limiting red meat: no more than a few times a month - When eating red meat: choose lean cuts and keep the portion to the size of deck of cards - Eggs: approx. 0 to 4 times a week  - Fish and lean poultry: at least 2 a week  - Healthy protein sources include, chicken, turkey, lean beef, lamb - Increase intake of seafood such as tuna, salmon, trout, mackerel, shrimp, scallops - Avoid or limit high fat processed meats such as sausage and bacon  Dairy - Include moderate amounts of low fat dairy products  - Focus on healthy dairy such as fat free yogurt, skim milk, low or reduced fat cheese - Limit dairy products higher in fat such as whole or 2% milk, cheese, ice cream  Alcohol - Moderate amounts of red wine is ok  - No more than 5 oz daily for women (all ages) and men older than age 65  - No more than 10 oz of wine daily for men younger than 65  Other - Limit sweets and other desserts  - Use herbs and spices instead of salt to flavor foods  - Herbs and spices common to the traditional Mediterranean Diet include: basil, bay leaves, chives, cloves, cumin, fennel, garlic, lavender, marjoram, mint, oregano, parsley, pepper, rosemary, sage, savory, sumac, tarragon, thyme   It's not just a diet, it's a lifestyle:  The Mediterranean diet includes lifestyle factors typical of those in the   region  Foods, drinks and meals are best eaten with others and savored Daily physical activity is important for overall good health This could be strenuous exercise like running and aerobics This could also be more leisurely activities such as walking, housework, yard-work, or taking the stairs Moderation is the key; a balanced and healthy diet accommodates most  foods and drinks Consider portion sizes and frequency of consumption of certain foods   Meal Ideas & Options:  Breakfast:  Whole wheat toast or whole wheat English muffins with peanut butter & hard boiled egg Steel cut oats topped with apples & cinnamon and skim milk  Fresh fruit: banana, strawberries, melon, berries, peaches  Smoothies: strawberries, bananas, greek yogurt, peanut butter Low fat greek yogurt with blueberries and granola  Egg white omelet with spinach and mushrooms Breakfast couscous: whole wheat couscous, apricots, skim milk, cranberries  Sandwiches:  Hummus and grilled vegetables (peppers, zucchini, squash) on whole wheat bread   Grilled chicken on whole wheat pita with lettuce, tomatoes, cucumbers or tzatziki  Jordan salad on whole wheat bread: tuna salad made with greek yogurt, olives, red peppers, capers, green onions Garlic rosemary lamb pita: lamb sauted with garlic, rosemary, salt & pepper; add lettuce, cucumber, greek yogurt to pita - flavor with lemon juice and black pepper  Seafood:  Mediterranean grilled salmon, seasoned with garlic, basil, parsley, lemon juice and black pepper Shrimp, lemon, and spinach whole-grain pasta salad made with low fat greek yogurt  Seared scallops with lemon orzo  Seared tuna steaks seasoned salt, pepper, coriander topped with tomato mixture of olives, tomatoes, olive oil, minced garlic, parsley, green onions and cappers  Meats:  Herbed greek chicken salad with kalamata olives, cucumber, feta  Red bell peppers stuffed with spinach, bulgur, lean ground beef (or lentils) & topped with feta   Kebabs: skewers of chicken, tomatoes, onions, zucchini, squash  [place] burgers: made with red onions, mint, dill, lemon juice, feta cheese topped with roasted red peppers Vegetarian Cucumber salad: cucumbers, artichoke hearts, celery, red onion, feta cheese, tossed in olive oil & lemon juice  Hummus and whole grain pita points with a greek salad  (lettuce, tomato, feta, olives, cucumbers, red onion) Lentil soup with celery, carrots made with vegetable broth, garlic, salt and pepper  Tabouli salad: parsley, bulgur, mint, scallions, cucumbers, tomato, radishes, lemon juice, olive oil, salt and pepper.     How to Use a Metered Dose Inhaler  A metered dose inhaler (MDI) is a handheld device filled with medicine that must be breathed into the lungs (inhaled). The medicine is delivered by pushing down on a metal canister. This releases a preset amount of spray and mist through the mouth and into the lungs. Each MDI canister holds a certain number of doses (puffs). Using a spacer with a metered dose inhaler may be recommended to help get more medicine into the lungs. A spacer is a plastic tube that connects to the MDI on one end and has a mouthpiece on the other end. A spacer holds the medicine in the tube for a short time. This allows more medicine to be inhaled. The MDI can be used to deliver many kinds of inhaled medicines, including: Quick relief or rescue medicines, such as bronchodilators. Controller medicines, such as corticosteroids. What are the risks? If you do not use your inhaler correctly, medicine might not reach your lungs to help you breathe. If you do not have enough strength to push down the canister to make it spray, ask your  health care provider for ways to help. The medicine in the MDI may cause side effects, such as: Mouth sores (thrush). Cough. Hoarseness. Shakiness. Headache. Supplies needed: A metered dose inhaler. A spacer, if recommended. How to use a metered dose inhaler without a spacer  Remove the cap from the inhaler. If you are using the inhaler for the first time, shake it for 5 seconds, turn it away from your face, then release 4 puffs into the air. This is called priming. Shake the inhaler for 5 seconds. Position the inhaler so the top of the canister faces up. Put your index finger on the top of the  medicine canister. Support the bottom of the inhaler with your thumb. Breathe out normally and as completely as possible, away from the inhaler. Either place the inhaler between your teeth and close your lips tightly around the mouthpiece, or hold the inhaler 1-2 inches (2.5-5 cm) away from your open mouth. Keep your tongue down out of the way. If you are unsure which technique to use, ask your health care provider. Press the canister down with your index finger to release the medicine. Inhale deeply and slowly through your mouth until your lungs are completely filled. Do not breathe in through your nose. Inhaling should take 4-6 seconds. Hold the medicine in your lungs for 5-10 seconds (10 seconds is best). This helps the medicine get into the small airways of your lungs. Remove the inhaler from your mouth, turn your head, and breathe out normally. Wait about 1 minute between puffs or as directed. Then repeat steps 3-10 until you have taken the number of puffs that your health care provider directed. Put the cap on the inhaler. If you are using a steroid inhaler, rinse your mouth with water, gargle, and spit out the water. Do not swallow the water. How to use a metered dose inhaler with a spacer  Remove the cap from the inhaler. If you are using the inhaler for the first time, shake it for 5 seconds, turn it away from your face, then release 4 puffs into the air. This is called priming. Shake the inhaler for 5 seconds. Place the open end of the spacer onto the inhaler mouthpiece. Position the inhaler so the top of the canister faces up and the spacer mouthpiece faces you. Put your index finger on the top of the medicine canister. Support the bottom of the inhaler and the spacer with your thumb. Breathe out normally and as completely as possible, away from the spacer. Place the spacer between your teeth and close your lips tightly around it. Keep your tongue down out of the way. Press the canister  down with your index finger to release the medicine, then inhale deeply and slowly through your mouth until your lungs are completely filled. Do not breathe in through your nose. Inhaling should take 4-6 seconds. Hold the medicine in your lungs for 5-10 seconds (10 seconds is best). This helps the medicine get into the small airways of your lungs. Remove the spacer from your mouth, turn your head, and breathe out normally. Wait about 1 minute between puffs or as directed. Then repeat steps 3-11 until you have taken the number of puffs that your health care provider directed. Remove the spacer from the inhaler and put the cap on the inhaler. If you are using a steroid inhaler, rinse your mouth with water, gargle, and spit out the water. Do not swallow the water. Follow these instructions at home: Caring for  your MDI Store your inhaler at or near room temperature. A cold MDI will not work properly. Follow directions on the package insert for care and cleaning of your MDI and spacer. General instructions Take your inhaled medicine only as told by your health care provider. Do not use the inhaler more than directed by your health care provider. Refill your MDI with medicine before all the preset doses have been used. If your inhaler has a counter, check it to determine how full your MDI is. The number you see tells you how many doses are left. If your inhaler does not have a counter, ask your health care provider when you will need to refill it. Then write the refill date on a calendar or on your MDI canister. Keep in mind that you cannot tell when the medicine in an inhaler is empty by shaking it. You may feel or hear something in the canister even when the preset medicine doses have been used up. Keeping track of your dosages is important. Do not use any products that contain nicotine or tobacco, such as cigarettes, e-cigarettes, and chewing tobacco. If you need help quitting, ask your health care  provider. Keep all follow-up visits as told by your health care provider. This is important. Where to find more information Centers for Disease Control and Prevention: FootballExhibition.com.br American Lung Association: www.lung.org Contact a health care provider if: Symptoms are only partially relieved with your inhaler. You are having trouble using your inhaler. You have side effects from the medicine. You have chills or a fever. You have night sweats. There is blood in your thick saliva (phlegm). Get help right away if: You have dizziness. You have a fast heart rate. You have severe shortness of breath. You have difficulty breathing. These symptoms may represent a serious problem that is an emergency. Do not wait to see if the symptoms will go away. Get medical help right away. Call your local emergency services (911 in the [place] Do not drive yourself to the hospital. Summary A metered dose inhaler is a handheld device for taking medicine that must be breathed into the lungs (inhaled). Take your inhaled medicine only as told by your health care provider. Do not use the inhaler more than directed by your health care provider. You cannot tell when the medicine is gone in an inhaler by shaking it. Refill it with medicine before all the preset doses have been used. Follow directions on the package insert for care and cleaning of your MDI and spacer. This information is not intended to replace advice given to you by your health care provider. Make sure you discuss any questions you have with your health care provider. Document Revised: 09/25/2019 Document Reviewed: 09/25/2019 Elsevier Patient Education  2023 [employer].

## 2022-06-24 NOTE — Progress Notes (Signed)
Patient: Brittany Patton, Female    DOB: 12-23-1960, 61 y.o.   MRN: [id number] Visit Date: 06/24/2022  Today's Provider: Elizabeth Sauer, MD   Chief Complaint  Patient presents with   Cough    Day 4 of doxy and cough syrup- feeling worse- cody aches, chills, fever   Subjective:     ----------------------------------------------------------- Cough This is a new problem. The current episode started 1 to 4 weeks ago (2 wweks). The problem has been waxing and waning. The problem occurs every few minutes. The cough is Non-productive. Pertinent negatives include no chills, ear pain, fever, headaches, hemoptysis, myalgias, rash, rhinorrhea, sore throat, shortness of breath or wheezing.    Lab Results  Component Value Date   CREATININE 0.76 06/11/2022   BUN 13 06/11/2022   NA 139 06/11/2022   K 4.5 06/11/2022   CL 101 06/11/2022   CO2 23 06/11/2022   Lab Results  Component Value Date   CHOL 248 (H) 06/11/2022   HDL 47 06/11/2022   LDLCALC 151 (H) 06/11/2022   LDLDIRECT 186 (H) 11/26/2021   TRIG 275 (H) 06/11/2022   CHOLHDL 4.3 01/11/2018   Lab Results  Component Value Date   TSH 1.140 03/08/2018   No results found for: "HGBA1C" Lab Results  Component Value Date   WBC 9.5 09/12/2015   HGB 13.8 03/08/2018   HCT 42.3 09/12/2015   MCV 82 09/12/2015   PLT 426 (H) 09/12/2015   Lab Results  Component Value Date   ALT 17 06/11/2022   AST 20 06/11/2022   ALKPHOS 100 06/11/2022   BILITOT 0.7 06/11/2022    Review of Systems  Constitutional: Negative.  Negative for chills, fatigue, fever and unexpected weight change.  HENT:  Negative for congestion, ear discharge, ear pain, rhinorrhea, sinus pressure, sneezing and sore throat.   Respiratory:  Positive for cough. Negative for hemoptysis, shortness of breath, wheezing and stridor.   Gastrointestinal:  Negative for abdominal pain, blood in stool, constipation, diarrhea and nausea.  Genitourinary:  Negative for dysuria, flank pain,  frequency, hematuria, urgency and vaginal discharge.  Musculoskeletal:  Negative for arthralgias, back pain and myalgias.  Skin:  Negative for rash.  Neurological:  Negative for dizziness, weakness and headaches.  Hematological:  Negative for adenopathy. Does not bruise/bleed easily.  Psychiatric/Behavioral:  Negative for dysphoric mood. The patient is not nervous/anxious.     Social History   Socioeconomic History   Marital status: Widowed    Spouse name: Not on file   Number of children: Not on file   Years of education: Not on file   Highest education level: Not on file  Occupational History   Not on file  Tobacco Use   Smoking status: Former    Types: Cigarettes    Quit date: 07/06/1996    Years since quitting: 25.9   Smokeless tobacco: Never  Vaping Use   Vaping Use: Never used  Substance and Sexual Activity   Alcohol use: Yes    Alcohol/week: 0.0 standard drinks of alcohol    Comment: rarely   Drug use: No   Sexual activity: Not Currently    Birth control/protection: Post-menopausal  Other Topics Concern   Not on file  Social History Narrative   Not on file   Social Determinants of Health   Financial Resource Strain: Not on file  Food Insecurity: Not on file  Transportation Needs: Not on file  Physical Activity: Not on file  Stress: Not on file  Social Connections: Not  on file  Intimate Partner Violence: Not on file    Patient Active Problem List   Diagnosis Date Noted   Hyperlipidemia 04/13/2016   Esophageal reflux 04/13/2016    Past Surgical History:  Procedure Laterality Date   CESAREAN SECTION     x 2   CHOLECYSTECTOMY, LAPAROSCOPIC  2015   COLONOSCOPY  2013   cleared for 10 yrs   TONSILLECTOMY      Her family history includes Breast cancer (age of onset: 20) in her maternal aunt; Diabetes in her mother; Glaucoma in her father; Heart disease in her father; Hypertension in her father.     No outpatient medications have been marked as taking  for the 06/24/22 encounter (Office Visit) with Juline Patch, MD.    Patient Care Team: Juline Patch, MD as PCP - General (Family Medicine)       Objective:   Wt Readings from Last 3 Encounters:  06/24/22 209 lb (94.8 kg)  06/20/22 209 lb (94.8 kg)  05/24/22 209 lb (94.8 kg)    Vitals: BP 128/60   Pulse 92   Temp 99.8 F (37.7 C) (Oral)   Ht 5\' 2"  (1.575 m)   Wt 209 lb (94.8 kg)   SpO2 96%   BMI 38.23 kg/m   Physical Exam HENT:     Right Ear: Tympanic membrane, ear canal and external ear normal. There is no impacted cerumen.     Left Ear: Tympanic membrane, ear canal and external ear normal. There is no impacted cerumen.     Nose: Nose normal.     Mouth/Throat:     Mouth: Mucous membranes are moist.     Activities of Daily Living     No data to display          Fall Risk Assessment    06/24/2022    2:47 PM 05/24/2022   10:40 AM 08/13/2020    1:59 PM 02/21/2020   10:10 AM 10/15/2015   10:57 AM  Fall Risk   Falls in the past year? 0 0 0 0 No  Number falls in past yr: 0 0 0    Injury with Fall? 0 0 0    Risk for fall due to : No Fall Risks No Fall Risks     Follow up Falls evaluation completed Falls evaluation completed Falls evaluation completed Falls evaluation completed      Depression Screen    06/24/2022    2:47 PM 05/24/2022   10:40 AM 11/26/2021   11:07 AM 08/13/2020    1:59 PM  PHQ 2/9 Scores  PHQ - 2 Score 0 0 0 0  PHQ- 9 Score 0 0 0 0        No data to display          Medicare Annual Wellness Visit Summary:  Reviewed patient's Family Medical History Reviewed and updated list of patient's medical providers Assessment of cognitive impairment was done Assessed patient's functional ability Established a written schedule for health screening [place] Completed and Reviewed  Exercise Activities and Dietary recommendations  Goals   None     Immunization History  Administered Date(s) Administered    Influenza Inj Mdck Quad Pf 05/18/2018   Influenza,inj,Quad PF,6+ Mos 06/06/2015, 04/13/2016, 06/07/2017, 05/24/2022   Influenza-Unspecified 06/06/2020   PFIZER(Purple Top)SARS-COV-2 Vaccination 09/25/2019, 10/30/2019, 07/18/2020   Tdap 09/12/2015   Zoster Recombinat (Shingrix) 05/18/2018, 09/07/2018    Health Maintenance  Topic Date Due   PAP SMEAR-Modifier  06/24/2022 (Originally 01/11/2021)   COVID-19 Vaccine (4 - Pfizer series) 07/10/2022 (Originally 09/12/2020)   MAMMOGRAM  11/27/2022 (Originally 01/24/2020)   COLONOSCOPY (Pts 45-53yrs Insurance coverage will need to be confirmed)  11/27/2022 (Originally 08/31/2018)   HIV Screening  11/27/2022 (Originally [birth year]/1976)   TETANUS/TDAP  09/11/2025   INFLUENZA VACCINE  Completed   Hepatitis C Screening  Completed   Zoster Vaccines- Shingrix  Completed   HPV VACCINES  Aged Out    Discussed health benefits of physical activity, and encouraged her to engage in regular exercise appropriate for her age and condition.    ------------------------------------------------------------------------------------------------------------  Assessment & Plan:     1. Acute cough New onset.  Continued.  Persistent.  Patient continues to have a nonproductive cough with chills and fever at home.  We will discontinue doxycycline and initiate Levaquin 500 mg once a day.  Patient has also been given albuterol inhaler with AeroChamber.  We will refill cough preparation for relief.  Will obtain chest x-ray to evaluate if pneumonia. - DG Chest 2 View - POCT Influenza A/B  2. Fever and chills As noted above - DG Chest 2 View - POCT Influenza A/B

## 2022-06-28 ENCOUNTER — Encounter: Payer: Self-pay | Admitting: Family Medicine

## 2022-06-28 ENCOUNTER — Ambulatory Visit (INDEPENDENT_AMBULATORY_CARE_PROVIDER_SITE_OTHER): Payer: Self-pay | Admitting: Family Medicine

## 2022-06-28 VITALS — BP 128/70 | HR 92 | Ht 62.0 in | Wt 208.0 lb

## 2022-06-28 DIAGNOSIS — J189 Pneumonia, unspecified organism: Secondary | ICD-10-CM

## 2022-06-28 DIAGNOSIS — R110 Nausea: Secondary | ICD-10-CM

## 2022-06-28 MED ORDER — ONDANSETRON HCL 4 MG PO TABS: 4.0000 mg | ORAL_TABLET | Freq: Three times a day (TID) | ORAL | 0 refills | Status: DC | PRN

## 2022-06-28 MED ORDER — LEVOFLOXACIN 500 MG PO TABS: 500.0000 mg | ORAL_TABLET | Freq: Every day | ORAL | 0 refills | Status: AC

## 2022-06-28 NOTE — Progress Notes (Signed)
Date:  06/28/2022   Name:  Brittany Patton   DOB:  [birth year]   MRN:  [id number]   Chief Complaint: pneumonia follow up  Pneumonia She complains of cough and sputum production. There is no chest tightness, difficulty breathing, frequent throat clearing, hemoptysis, hoarse voice, shortness of breath or wheezing. The current episode started in the past 7 days. The problem has been gradually improving. The cough is productive of sputum. Associated symptoms include dyspnea on exertion. Pertinent negatives include no chest pain, fever, nasal congestion or postnasal drip. Her symptoms are alleviated by prescription cough suppressant (levaquin). She reports moderate improvement on treatment.    Lab Results  Component Value Date   NA 139 06/11/2022   K 4.5 06/11/2022   CO2 23 06/11/2022   GLUCOSE 98 06/11/2022   BUN 13 06/11/2022   CREATININE 0.76 06/11/2022   CALCIUM 9.9 06/11/2022   EGFR 89 06/11/2022   GFRNONAA 83 01/11/2018   Lab Results  Component Value Date   CHOL 248 (H) 06/11/2022   HDL 47 06/11/2022   LDLCALC 151 (H) 06/11/2022   LDLDIRECT 186 (H) 11/26/2021   TRIG 275 (H) 06/11/2022   CHOLHDL 4.3 01/11/2018   Lab Results  Component Value Date   TSH 1.140 03/08/2018   No results found for: "HGBA1C" Lab Results  Component Value Date   WBC 9.5 09/12/2015   HGB 13.8 03/08/2018   HCT 42.3 09/12/2015   MCV 82 09/12/2015   PLT 426 (H) 09/12/2015   Lab Results  Component Value Date   ALT 17 06/11/2022   AST 20 06/11/2022   ALKPHOS 100 06/11/2022   BILITOT 0.7 06/11/2022   No results found for: "25OHVITD2", "25OHVITD3", "VD25OH"   Review of Systems  Constitutional:  Positive for fatigue. Negative for chills, diaphoresis and fever.  HENT:  Negative for hoarse voice and postnasal drip.   Respiratory:  Positive for cough and sputum production. Negative for hemoptysis, chest tightness, shortness of breath, wheezing and stridor.   Cardiovascular:  Positive for dyspnea  on exertion. Negative for chest pain, palpitations and leg swelling.  Gastrointestinal:  Positive for nausea. Negative for blood in stool, constipation and diarrhea.    Patient Active Problem List   Diagnosis Date Noted   Hyperlipidemia 04/13/2016   Esophageal reflux 04/13/2016    Allergies  Allergen Reactions   Statins Other (See Comments)    Leg cramps   Sulfa Antibiotics Hives    Past Surgical History:  Procedure Laterality Date   CESAREAN SECTION     x 2   CHOLECYSTECTOMY, LAPAROSCOPIC  2015   COLONOSCOPY  2013   cleared for 10 yrs   TONSILLECTOMY      Social History   Tobacco Use   Smoking status: Former    Types: Cigarettes    Quit date: 07/06/1996    Years since quitting: 25.9   Smokeless tobacco: Never  Vaping Use   Vaping Use: Never used  Substance Use Topics   Alcohol use: Yes    Alcohol/week: 0.0 standard drinks of alcohol    Comment: rarely   Drug use: No     Medication list has been reviewed and updated.  Current Meds  Medication Sig   albuterol (VENTOLIN HFA) 108 (90 Base) MCG/ACT inhaler Inhale 2 puffs into the lungs every 6 (six) hours as needed for wheezing or shortness of breath.   atorvastatin (LIPITOR) 10 MG tablet Take 1 tablet (10 mg total) by mouth daily.   ezetimibe (ZETIA)  10 MG tablet Take 1 tablet (10 mg total) by mouth daily.   famotidine (PEPCID) 20 MG tablet Take 20 mg by mouth daily as needed for heartburn or indigestion.   guaiFENesin-codeine (CHERATUSSIN AC) 100-10 MG/5ML syrup 5-10 ml po TID prn cough   levofloxacin (LEVAQUIN) 500 MG tablet Take 1 tablet (500 mg total) by mouth daily for 7 days.       06/24/2022    2:48 PM 05/24/2022   10:40 AM 11/26/2021   11:07 AM 08/13/2020    1:59 PM  GAD 7 : Generalized Anxiety Score  Nervous, Anxious, on Edge 0 0 0 0  Control/stop worrying 0 0 0 0  Worry too much - different things 0 0 0 0  Trouble relaxing 0 0 0 0  Restless 0 0 0 0  Easily annoyed or irritable 0 0 0 0  Afraid -  awful might happen 0 0 0 0  Total GAD 7 Score 0 0 0 0  Anxiety Difficulty Not difficult at all Not difficult at all Not difficult at all Not difficult at all       06/24/2022    2:47 PM 05/24/2022   10:40 AM 11/26/2021   11:07 AM  Depression screen PHQ 2/9  Decreased Interest 0 0 0  Down, Depressed, Hopeless 0 0 0  PHQ - 2 Score 0 0 0  Altered sleeping 0 0 0  Tired, decreased energy 0 0 0  Change in appetite 0 0 0  Feeling bad or failure about yourself  0 0 0  Trouble concentrating 0 0 0  Moving slowly or fidgety/restless 0 0 0  Suicidal thoughts 0 0 0  PHQ-9 Score 0 0 0  Difficult doing work/chores Not difficult at all Not difficult at all Not difficult at all    BP Readings from Last 3 Encounters:  06/28/22 128/70  06/24/22 128/60  06/20/22 137/84    Physical Exam Vitals and nursing note reviewed. Exam conducted with a chaperone present.  Constitutional:      General: She is not in acute distress.    Appearance: She is not diaphoretic.  HENT:     Head: Normocephalic and atraumatic.     Right Ear: Tympanic membrane and external ear normal.     Left Ear: Tympanic membrane and external ear normal.     Nose: Nose normal. No congestion or rhinorrhea.     Mouth/Throat:     Mouth: Mucous membranes are moist.  Eyes:     General:        Right eye: No discharge.        Left eye: No discharge.     Extraocular Movements: Extraocular movements intact.     Conjunctiva/sclera: Conjunctivae normal.     Pupils: Pupils are equal, round, and reactive to light.  Neck:     Thyroid: No thyromegaly.     Vascular: No JVD.  Cardiovascular:     Rate and Rhythm: Normal rate and regular rhythm.     Heart sounds: Normal heart sounds. No murmur heard.    No friction rub. No gallop.  Pulmonary:     Effort: Pulmonary effort is normal.     Breath sounds: Normal breath sounds. No wheezing or rhonchi.  Abdominal:     General: Bowel sounds are normal.     Palpations: Abdomen is soft. There is  no mass.     Tenderness: There is no abdominal tenderness. There is no guarding.  Musculoskeletal:  General: Normal range of motion.     Cervical back: Normal range of motion and neck supple.  Lymphadenopathy:     Cervical: No cervical adenopathy.  Skin:    General: Skin is warm and dry.  Neurological:     Mental Status: She is alert.     Wt Readings from Last 3 Encounters:  06/28/22 208 lb (94.3 kg)  06/24/22 209 lb (94.8 kg)  06/20/22 209 lb (94.8 kg)    BP 128/70   Pulse 92   Ht _0  (1.575 m)   Wt 208 lb (94.3 kg)   SpO2 96%   BMI 38.04 kg/m   Assessment and Plan:  1. Pneumonia of left lower lobe due to infectious organism New onset.  Improving.  Controlled.  Subjectively and objectively patient is improving as far as her pneumonia in the left lower lobe.  Improved shortness of breath as well as improved pulse ox as well as increased aeration in the left lower lobe.  Continue with Levaquin for 10-day course and patient is encouraged to use a partial dose of cough suppressant and may switch to Mucinex DM later in the course.  2. Nausea New onset.  Likely secondary to the codeine.  Doing well otherwise.  Patient has been given some Zofran 4 mg every  8 to 12 hours to take on a as needed basis is that this is limiting her daily activities as well.    Otilio Miu, MD

## 2022-07-06 ENCOUNTER — Ambulatory Visit: Payer: Self-pay | Admitting: *Deleted

## 2022-07-06 ENCOUNTER — Telehealth: Payer: Self-pay

## 2022-07-06 ENCOUNTER — Other Ambulatory Visit: Payer: Self-pay | Admitting: *Deleted

## 2022-07-06 ENCOUNTER — Other Ambulatory Visit: Payer: Self-pay

## 2022-07-06 DIAGNOSIS — R051 Acute cough: Secondary | ICD-10-CM

## 2022-07-06 MED ORDER — BENZONATATE 200 MG PO CAPS: 200.0000 mg | ORAL_CAPSULE | Freq: Two times a day (BID) | ORAL | 0 refills | Status: DC | PRN

## 2022-07-06 NOTE — Telephone Encounter (Signed)
Summary: cough   Patient last seen on Monday 06/28/2022, cough is not improving, patient requesting a refill of cough medication.  [facility] - [place], [place] - [address] Phone: [phone] Fax: [phone]      Chief Complaint: requesting refill on cough medication, coughing spells at night  Symptoms: cough not going away x 2 weeks. Less productive. Coughing spells at night can't sleep  Frequency: 2 weeks  Pertinent Negatives: Patient denies difficulty breathing no fever, coughing up very little mucus.  Disposition: [] ED /[] Urgent Care (no appt availability in office) / [] Appointment(In office/virtual)/ []  [facility]/ [] Home Care/ [] Refused Recommended Disposition /[] [facility]/ [x]  Follow-up with PCP Additional Notes:   Requesting cough medication refill that controlled.        Reason for Disposition  Cough has been present for > 3 weeks  Answer Assessment - Initial Assessment Questions 1. ONSET: "When did the cough begin?"      2 weeks ago  2. SEVERITY: "How bad is the cough today?"      Coughing spells at night  3. SPUTUM: "Describe the color of your sputum" (none, dry cough; clear, white, yellow, green)     Very little  4. HEMOPTYSIS: "Are you coughing up any blood?" If so ask: "How much?" (flecks, streaks, tablespoons, etc.)     no 5. DIFFICULTY BREATHING: "Are you having difficulty breathing?" If Yes, ask: "How bad is it?" (e.g., mild, moderate, severe)    - MILD: No SOB at rest, mild SOB with walking, speaks normally in sentences, can lie down, no retractions, pulse < 100.    - MODERATE: SOB at rest, SOB with minimal exertion and prefers to sit, cannot lie down flat, speaks in phrases, mild retractions, audible wheezing, pulse 100-120.    - SEVERE: Very SOB at rest, speaks in single words, struggling to breathe, sitting hunched forward, retractions, pulse > 120      No  6. FEVER: "Do you have a fever?" If Yes, ask: "What is your  temperature, how was it measured, and when did it start?"     na 7. CARDIAC HISTORY: "Do you have any history of heart disease?" (e.g., heart attack, congestive heart failure)      na 8. LUNG HISTORY: "Do you have any history of lung disease?"  (e.g., pulmonary embolus, asthma, emphysema)     na 9. PE RISK FACTORS: "Do you have a history of blood clots?" (or: recent major surgery, recent prolonged travel, bedridden)     na 10. OTHER SYMPTOMS: "Do you have any other symptoms?" (e.g., runny nose, wheezing, chest pain)       Coughing spells  11. PREGNANCY: "Is there any chance you are pregnant?" "When was your last menstrual period?"       na 12. TRAVEL: "Have you traveled out of the country in the last month?" (e.g., travel history, exposures)       na  Protocols used: Cough - Acute Productive-A-AH

## 2022-07-06 NOTE — Telephone Encounter (Signed)
Pt requested more cough syrup. She had 2 rounds of cough syrup with codeine, therefore we sent in tessalon perles for the cough

## 2022-09-14 ENCOUNTER — Encounter: Payer: Medicaid Other | Admitting: Family Medicine

## 2022-09-17 ENCOUNTER — Other Ambulatory Visit (HOSPITAL_COMMUNITY)
Admission: RE | Admit: 2022-09-17 | Discharge: 2022-09-17 | Disposition: A | Discharge status: Home / Self Care | Payer: Medicaid Other | Source: Ambulatory Visit | Attending: Family Medicine | Admitting: Family Medicine

## 2022-09-17 ENCOUNTER — Encounter: Payer: Self-pay | Admitting: Family Medicine

## 2022-09-17 ENCOUNTER — Ambulatory Visit (INDEPENDENT_AMBULATORY_CARE_PROVIDER_SITE_OTHER): Payer: Medicaid Other | Admitting: Family Medicine

## 2022-09-17 ENCOUNTER — Other Ambulatory Visit
Admission: RE | Admit: 2022-09-17 | Discharge: 2022-09-17 | Disposition: A | Discharge status: Home / Self Care | Payer: Medicaid Other | Attending: Family Medicine | Admitting: Family Medicine

## 2022-09-17 VITALS — BP 124/68 | HR 78 | Ht 62.0 in | Wt 207.0 lb

## 2022-09-17 DIAGNOSIS — Z124 Encounter for screening for malignant neoplasm of cervix: Secondary | ICD-10-CM | POA: Diagnosis not present

## 2022-09-17 DIAGNOSIS — Z0000 Encounter for general adult medical examination without abnormal findings: Secondary | ICD-10-CM

## 2022-09-17 DIAGNOSIS — Z1211 Encounter for screening for malignant neoplasm of colon: Secondary | ICD-10-CM | POA: Diagnosis not present

## 2022-09-17 DIAGNOSIS — Z1231 Encounter for screening mammogram for malignant neoplasm of breast: Secondary | ICD-10-CM | POA: Diagnosis not present

## 2022-09-17 DIAGNOSIS — E7849 Other hyperlipidemia: Secondary | ICD-10-CM

## 2022-09-17 DIAGNOSIS — E785 Hyperlipidemia, unspecified: Secondary | ICD-10-CM | POA: Insufficient documentation

## 2022-09-17 LAB — LIPID PANEL
Cholesterol: 226 mg/dL — ABNORMAL HIGH (ref 0–200)
HDL: 49 mg/dL (ref >40)
LDL Cholesterol: 129 mg/dL — ABNORMAL HIGH (ref 0–99)
Total CHOL/HDL Ratio: 4.6 RATIO
Triglycerides: 238 mg/dL — ABNORMAL HIGH (ref <150)
VLDL: 48 mg/dL — ABNORMAL HIGH (ref 0–40)

## 2022-09-17 NOTE — Progress Notes (Signed)
Date:  09/17/2022   Name:  Brittany Patton   DOB:  09/17/1960   MRN:  [id number]   Chief Complaint: Annual Exam  Patient is a 62 year old female who presents for a comprehensive physical exam. The patient reports the following problems: none. Health maintenance has been reviewed up to date.      Lab Results  Component Value Date   NA 139 06/11/2022   K 4.5 06/11/2022   CO2 23 06/11/2022   GLUCOSE 98 06/11/2022   BUN 13 06/11/2022   CREATININE 0.76 06/11/2022   CALCIUM 9.9 06/11/2022   EGFR 89 06/11/2022   GFRNONAA 83 01/11/2018   Lab Results  Component Value Date   CHOL 248 (H) 06/11/2022   HDL 47 06/11/2022   LDLCALC 151 (H) 06/11/2022   LDLDIRECT 186 (H) 11/26/2021   TRIG 275 (H) 06/11/2022   CHOLHDL 4.3 01/11/2018   Lab Results  Component Value Date   TSH 1.140 03/08/2018   No results found for: "HGBA1C" Lab Results  Component Value Date   WBC 9.5 09/12/2015   HGB 13.8 03/08/2018   HCT 42.3 09/12/2015   MCV 82 09/12/2015   PLT 426 (H) 09/12/2015   Lab Results  Component Value Date   ALT 17 06/11/2022   AST 20 06/11/2022   ALKPHOS 100 06/11/2022   BILITOT 0.7 06/11/2022   No results found for: "25OHVITD2", "25OHVITD3", "VD25OH"   Review of Systems  Constitutional: Negative.  Negative for chills, fatigue, fever and unexpected weight change.  HENT:  Negative for congestion, ear discharge, ear pain, rhinorrhea, sinus pressure, sneezing, sore throat and trouble swallowing.   Eyes:  Negative for visual disturbance.  Respiratory:  Negative for cough, chest tightness, shortness of breath, wheezing and stridor.   Cardiovascular:  Negative for chest pain, palpitations and leg swelling.  Gastrointestinal:  Negative for abdominal pain, blood in stool, constipation, diarrhea and nausea.  Genitourinary:  Negative for dysuria, flank pain, frequency, hematuria, menstrual problem, urgency, vaginal bleeding and vaginal discharge.  Musculoskeletal:  Negative for  arthralgias, back pain and myalgias.  Skin:  Negative for rash.  Neurological:  Negative for dizziness, weakness and headaches.  Hematological:  Negative for adenopathy. Does not bruise/bleed easily.  Psychiatric/Behavioral:  Negative for dysphoric mood. The patient is not nervous/anxious.     Patient Active Problem List   Diagnosis Date Noted   Hyperlipidemia 04/13/2016   Esophageal reflux 04/13/2016    Allergies  Allergen Reactions   Statins Other (See Comments)    Leg cramps   Sulfa Antibiotics Hives    Past Surgical History:  Procedure Laterality Date   CESAREAN SECTION     x 2   CHOLECYSTECTOMY, LAPAROSCOPIC  2015   COLONOSCOPY  2013   cleared for 10 yrs   TONSILLECTOMY      Social History   Tobacco Use   Smoking status: Former    Types: Cigarettes    Quit date: 07/06/1996    Years since quitting: 26.2   Smokeless tobacco: Never  Vaping Use   Vaping Use: Never used  Substance Use Topics   Alcohol use: Yes    Alcohol/week: 0.0 standard drinks of alcohol    Comment: rarely   Drug use: No     Medication list has been reviewed and updated.  Current Meds  Medication Sig   albuterol (VENTOLIN HFA) 108 (90 Base) MCG/ACT inhaler Inhale 2 puffs into the lungs every 6 (six) hours as needed for wheezing or shortness  of breath.   atorvastatin (LIPITOR) 10 MG tablet Take 1 tablet (10 mg total) by mouth daily.   ezetimibe (ZETIA) 10 MG tablet Take 1 tablet (10 mg total) by mouth daily.   famotidine (PEPCID) 20 MG tablet Take 20 mg by mouth daily as needed for heartburn or indigestion.   ondansetron (ZOFRAN) 4 MG tablet Take 1 tablet (4 mg total) by mouth every 8 (eight) hours as needed for nausea or vomiting.       09/17/2022   11:00 AM 06/24/2022    2:48 PM 05/24/2022   10:40 AM 11/26/2021   11:07 AM  GAD 7 : Generalized Anxiety Score  Nervous, Anxious, on Edge 0 0 0 0  Control/stop worrying 0 0 0 0  Worry too much - different things 0 0 0 0  Trouble relaxing 0  0 0 0  Restless 0 0 0 0  Easily annoyed or irritable 0 0 0 0  Afraid - awful might happen 0 0 0 0  Total GAD 7 Score 0 0 0 0  Anxiety Difficulty Not difficult at all Not difficult at all Not difficult at all Not difficult at all       09/17/2022   11:00 AM 06/24/2022    2:47 PM 05/24/2022   10:40 AM  Depression screen PHQ 2/9  Decreased Interest 0 0 0  Down, Depressed, Hopeless 0 0 0  PHQ - 2 Score 0 0 0  Altered sleeping 0 0 0  Tired, decreased energy 0 0 0  Change in appetite 0 0 0  Feeling bad or failure about yourself  0 0 0  Trouble concentrating 0 0 0  Moving slowly or fidgety/restless 0 0 0  Suicidal thoughts 0 0 0  PHQ-9 Score 0 0 0  Difficult doing work/chores Not difficult at all Not difficult at all Not difficult at all    BP Readings from Last 3 Encounters:  09/17/22 124/68  06/28/22 128/70  06/24/22 128/60    Physical Exam Vitals and nursing note reviewed. Exam conducted with a chaperone present.  Constitutional:      General: She is not in acute distress.    Appearance: She is not diaphoretic.  HENT:     Head: Normocephalic and atraumatic.     Right Ear: Tympanic membrane and external ear normal.     Left Ear: Tympanic membrane and external ear normal.     Nose: Nose normal. No congestion.     Mouth/Throat:     Mouth: Mucous membranes are moist.  Eyes:     General:        Right eye: No discharge.        Left eye: No discharge.     Conjunctiva/sclera: Conjunctivae normal.     Pupils: Pupils are equal, round, and reactive to light.  Neck:     Thyroid: No thyromegaly.     Vascular: No JVD.  Cardiovascular:     Rate and Rhythm: Normal rate and regular rhythm.     Heart sounds: Normal heart sounds, S1 normal and S2 normal. No murmur heard.    No systolic murmur is present.     No diastolic murmur is present.     No friction rub. No gallop. No S3 or S4 sounds.  Pulmonary:     Effort: Pulmonary effort is normal.     Breath sounds: Normal breath  sounds. No decreased breath sounds, wheezing, rhonchi or rales.  Chest:     Chest wall: No tenderness.  Breasts:    Right: Normal. No swelling, bleeding, inverted nipple, mass, nipple discharge, skin change or tenderness.     Left: Normal. No swelling, bleeding, inverted nipple, mass, nipple discharge, skin change or tenderness.  Abdominal:     General: Bowel sounds are normal.     Palpations: Abdomen is soft. There is no hepatomegaly, splenomegaly or mass.     Tenderness: There is no abdominal tenderness. There is no guarding.  Genitourinary:    Vagina: Normal.     Cervix: Normal.     Uterus: Normal.      Adnexa: Right adnexa normal and left adnexa normal.     Rectum: Normal. Guaiac result negative. No mass.  Musculoskeletal:        General: Normal range of motion.     Cervical back: Normal range of motion and neck supple.     Right lower leg: No edema.     Left lower leg: No edema.  Lymphadenopathy:     Head:     Right side of head: No submental or submandibular adenopathy.     Left side of head: No submental or submandibular adenopathy.     Cervical: No cervical adenopathy.     Right cervical: No superficial, deep or posterior cervical adenopathy.    Left cervical: No superficial, deep or posterior cervical adenopathy.     Upper Body:     Right upper body: No supraclavicular or axillary adenopathy.     Left upper body: No supraclavicular or axillary adenopathy.  Skin:    General: Skin is warm and dry.  Neurological:     Mental Status: She is alert.     Deep Tendon Reflexes: Reflexes are normal and symmetric.     Wt Readings from Last 3 Encounters:  09/17/22 207 lb (93.9 kg)  06/28/22 208 lb (94.3 kg)  06/24/22 209 lb (94.8 kg)    BP 124/68   Pulse 78   Ht 5\' 2"  (1.575 m)   Wt 207 lb (93.9 kg)   SpO2 98%   BMI 37.86 kg/m   Assessment and Plan:  1. Annual physical exam No subjective/objective concerns noted during HPI, review of past medical history and  medications, review of systems, and physical exam.  2. Encounter for screening mammogram for malignant neoplasm of breast Breast exam was done with no palpable masses or abnormality noted we will refer for mammography.  3. Encounter for screening colonoscopy Exam was done with no evidence of rectal mass and guaiac is negative. - Ambulatory referral to Gastroenterology  4. Screening for cervical cancer Cervical Pap bimanual GYN exam done with no abnormality of the vagina, cervix, or palpation of the uterus adnexal.  Pap with HPV done. - Cytology - PAP  5. Other hyperlipidemia Patient has history of hyperlipidemia we will check lipid panel - Lipid Panel With LDL/HDL Ratio    Otilio Miu, MD

## 2022-09-21 LAB — CYTOLOGY - PAP
Comment: NEGATIVE
Diagnosis: NEGATIVE
High risk HPV: NEGATIVE

## 2022-09-22 ENCOUNTER — Other Ambulatory Visit: Payer: Self-pay

## 2022-09-22 DIAGNOSIS — E782 Mixed hyperlipidemia: Secondary | ICD-10-CM

## 2022-09-22 MED ORDER — ATORVASTATIN CALCIUM 20 MG PO TABS: 20.0000 mg | ORAL_TABLET | Freq: Every day | ORAL | 1 refills | Status: DC

## 2022-09-23 ENCOUNTER — Other Ambulatory Visit: Payer: Self-pay

## 2022-09-23 ENCOUNTER — Telehealth: Payer: Self-pay

## 2022-09-23 DIAGNOSIS — Z86010 Personal history of colonic polyps: Secondary | ICD-10-CM

## 2022-09-23 MED ORDER — NA SULFATE-K SULFATE-MG SULF 17.5-3.13-1.6 GM/177ML PO SOLN: 354.0000 mL | Freq: Once | ORAL | 0 refills | Status: AC

## 2022-09-23 NOTE — Telephone Encounter (Signed)
Gastroenterology Pre-Procedure Review  Request Date: 11/11/2022 Requesting Physician: Dr. Marius Ditch  PATIENT REVIEW QUESTIONS: The patient responded to the following health history questions as indicated:    1. Are you having any GI issues? no 2. Do you have a personal history of Polyps? Yes 1 polyp removed  3. Do you have a family history of Colon Cancer or Polyps? no 4. Diabetes Mellitus? no 5. Joint replacements in the past 12 months?no 6. Major health problems in the past 3 months?no 7. Any artificial heart valves, MVP, or defibrillator?no    MEDICATIONS & ALLERGIES:    Patient reports the following regarding taking any anticoagulation/antiplatelet therapy:   Plavix, Coumadin, Eliquis, Xarelto, Lovenox, Pradaxa, Brilinta, or Effient? no Aspirin? no  Patient confirms/reports the following medications:  Current Outpatient Medications  Medication Sig Dispense Refill   albuterol (VENTOLIN HFA) 108 (90 Base) MCG/ACT inhaler Inhale 2 puffs into the lungs every 6 (six) hours as needed for wheezing or shortness of breath. 8 g 2   atorvastatin (LIPITOR) 20 MG tablet Take 1 tablet (20 mg total) by mouth daily. 90 tablet 1   ezetimibe (ZETIA) 10 MG tablet Take 1 tablet (10 mg total) by mouth daily. 30 tablet 0   famotidine (PEPCID) 20 MG tablet Take 20 mg by mouth daily as needed for heartburn or indigestion.     ondansetron (ZOFRAN) 4 MG tablet Take 1 tablet (4 mg total) by mouth every 8 (eight) hours as needed for nausea or vomiting. 20 tablet 0   No current facility-administered medications for this visit.    Patient confirms/reports the following allergies:  Allergies  Allergen Reactions   Statins Other (See Comments)    Leg cramps   Sulfa Antibiotics Hives    No orders of the defined types were placed in this encounter.   AUTHORIZATION INFORMATION Primary Insurance: 1D#: Group #:  Secondary Insurance: 1D#: Group #:  SCHEDULE INFORMATION: Date:  Time: Location:

## 2022-09-24 ENCOUNTER — Telehealth: Payer: Self-pay | Admitting: Gastroenterology

## 2022-09-24 DIAGNOSIS — Z86010 Personal history of colonic polyps: Secondary | ICD-10-CM

## 2022-09-24 MED ORDER — SUTAB 1479-225-188 MG PO TABS: 12.0000 | ORAL_TABLET | Freq: Two times a day (BID) | ORAL | 0 refills | Status: DC

## 2022-09-24 NOTE — Addendum Note (Signed)
Addended by: Vanetta Mulders on: 09/24/2022 11:10 AM   Modules accepted: Orders

## 2022-09-24 NOTE — Telephone Encounter (Signed)
Pt would like to change prep to pill form please return call-[phone]

## 2022-09-24 NOTE — Telephone Encounter (Signed)
Pt call returned.  SuTab Rx sent to [facility].  Informed her that Sutab Bowel Prep is not covered by her insurance and will be more expensive however she can go to the website [email or web address] to get mfr coupon.  She said she will call to see how much it will cost first.  Instructions informed as follows: At 5pm:  Take 12 tablets with 16oz of water within 20-30minutes. An hour later drink another 16oz of water in 30 minutes. 30 minutes after that drink the last 16oz of water.  5hours before:  Take 12 tablets with 16oz of water within 20-30minutes. An hour later drink another 16oz of water in 30 minutes. 30 minutes after that drink the last 16oz of water.    Thanks, [place], [place]

## 2022-11-02 ENCOUNTER — Encounter: Payer: Self-pay | Admitting: Gastroenterology

## 2022-11-11 ENCOUNTER — Encounter
Admission: RE | Disposition: A | Discharge status: Home / Self Care | Payer: Self-pay | Source: Home / Self Care | Attending: Gastroenterology

## 2022-11-11 ENCOUNTER — Ambulatory Visit
Admission: RE | Admit: 2022-11-11 | Discharge: 2022-11-11 | Disposition: A | Discharge status: Home / Self Care | Payer: Medicaid Other | Attending: Gastroenterology | Admitting: Gastroenterology

## 2022-11-11 ENCOUNTER — Other Ambulatory Visit: Payer: Self-pay

## 2022-11-11 ENCOUNTER — Ambulatory Visit: Payer: Medicaid Other | Admitting: Anesthesiology

## 2022-11-11 ENCOUNTER — Encounter: Payer: Self-pay | Admitting: Gastroenterology

## 2022-11-11 DIAGNOSIS — Z1211 Encounter for screening for malignant neoplasm of colon: Secondary | ICD-10-CM | POA: Insufficient documentation

## 2022-11-11 DIAGNOSIS — D123 Benign neoplasm of transverse colon: Secondary | ICD-10-CM | POA: Insufficient documentation

## 2022-11-11 DIAGNOSIS — K219 Gastro-esophageal reflux disease without esophagitis: Secondary | ICD-10-CM | POA: Diagnosis not present

## 2022-11-11 DIAGNOSIS — T7840XA Allergy, unspecified, initial encounter: Secondary | ICD-10-CM | POA: Diagnosis not present

## 2022-11-11 DIAGNOSIS — Z09 Encounter for follow-up examination after completed treatment for conditions other than malignant neoplasm: Secondary | ICD-10-CM | POA: Insufficient documentation

## 2022-11-11 DIAGNOSIS — D126 Benign neoplasm of colon, unspecified: Secondary | ICD-10-CM | POA: Diagnosis not present

## 2022-11-11 DIAGNOSIS — E669 Obesity, unspecified: Secondary | ICD-10-CM | POA: Diagnosis not present

## 2022-11-11 DIAGNOSIS — K644 Residual hemorrhoidal skin tags: Secondary | ICD-10-CM | POA: Insufficient documentation

## 2022-11-11 DIAGNOSIS — D120 Benign neoplasm of cecum: Secondary | ICD-10-CM | POA: Insufficient documentation

## 2022-11-11 DIAGNOSIS — K635 Polyp of colon: Secondary | ICD-10-CM

## 2022-11-11 DIAGNOSIS — D125 Benign neoplasm of sigmoid colon: Secondary | ICD-10-CM | POA: Insufficient documentation

## 2022-11-11 DIAGNOSIS — Z860100 Personal history of colon polyps, unspecified: Secondary | ICD-10-CM

## 2022-11-11 DIAGNOSIS — Z6838 Body mass index (BMI) 38.0-38.9, adult: Secondary | ICD-10-CM | POA: Diagnosis not present

## 2022-11-11 DIAGNOSIS — K643 Fourth degree hemorrhoids: Secondary | ICD-10-CM | POA: Insufficient documentation

## 2022-11-11 DIAGNOSIS — Z87891 Personal history of nicotine dependence: Secondary | ICD-10-CM | POA: Diagnosis not present

## 2022-11-11 HISTORY — PX: COLONOSCOPY WITH PROPOFOL: SHX5780

## 2022-11-11 SURGERY — COLONOSCOPY WITH PROPOFOL
Anesthesia: General | Site: Rectum

## 2022-11-11 MED ORDER — STERILE WATER FOR IRRIGATION IR SOLN
Status: DC | PRN
  Administered 2022-11-11: 120 mL

## 2022-11-11 MED ORDER — SODIUM CHLORIDE 0.9 % IV SOLN: INTRAVENOUS | Status: DC

## 2022-11-11 MED ORDER — PROPOFOL 10 MG/ML IV BOLUS
INTRAVENOUS | Status: DC | PRN
  Administered 2022-11-11 (×3): 40 mg via INTRAVENOUS
  Administered 2022-11-11: 60 mg via INTRAVENOUS
  Administered 2022-11-11: 20 mg via INTRAVENOUS
  Administered 2022-11-11: 40 mg via INTRAVENOUS
  Administered 2022-11-11: 20 mg via INTRAVENOUS

## 2022-11-11 MED ORDER — ACETAMINOPHEN 325 MG PO TABS: 650.0000 mg | ORAL_TABLET | Freq: Once | ORAL | Status: DC | PRN

## 2022-11-11 MED ORDER — ONDANSETRON HCL 4 MG/2ML IJ SOLN: 4.0000 mg | Freq: Once | INTRAMUSCULAR | Status: DC | PRN

## 2022-11-11 MED ORDER — STERILE WATER FOR IRRIGATION IR SOLN
Status: DC | PRN
  Administered 2022-11-11: 150 mL

## 2022-11-11 MED ORDER — ACETAMINOPHEN 160 MG/5ML PO SOLN: 325.0000 mg | ORAL | Status: DC | PRN

## 2022-11-11 MED ORDER — LIDOCAINE HCL (CARDIAC) PF 100 MG/5ML IV SOSY
PREFILLED_SYRINGE | INTRAVENOUS | Status: DC | PRN
  Administered 2022-11-11: 80 mg via INTRAVENOUS

## 2022-11-11 MED ORDER — LACTATED RINGERS IV SOLN: INTRAVENOUS | Status: DC

## 2022-11-11 SURGICAL SUPPLY — 25 items

## 2022-11-11 NOTE — Transfer of Care (Signed)
Immediate Anesthesia Transfer of Care Note  Patient: Brittany Patton  Procedure(s) Performed: COLONOSCOPY WITH PROPOFOL (Rectum)  Patient Location: PACU  Anesthesia Type: General  Level of Consciousness: awake, alert  and patient cooperative  Airway and Oxygen Therapy: Patient Spontanous Breathing and Patient connected to supplemental oxygen  Post-op Assessment: Post-op Vital signs reviewed, Patient's Cardiovascular Status Stable, Respiratory Function Stable, Patent Airway and No signs of Nausea or vomiting  Post-op Vital Signs: Reviewed and stable  Complications: No notable events documented.

## 2022-11-11 NOTE — Anesthesia Preprocedure Evaluation (Addendum)
Anesthesia Evaluation  Patient identified by MRN, date of birth, ID band Patient awake    Reviewed: Allergy & Precautions, NPO status , Patient's Chart, lab work & pertinent test results  History of Anesthesia Complications Negative for: history of anesthetic complications  Airway Mallampati: III   Neck ROM: Full    Dental  (+) Missing   Pulmonary former smoker (quit 1997)   Pulmonary exam normal breath sounds clear to auscultation       Cardiovascular Exercise Tolerance: Good negative cardio ROS Normal cardiovascular exam Rhythm:Regular Rate:Normal     Neuro/Psych  PSYCHIATRIC DISORDERS  Depression    negative neurological ROS     GI/Hepatic ,GERD  ,,  Endo/Other  Obesity   Renal/GU negative Renal ROS     Musculoskeletal   Abdominal   Peds  Hematology negative hematology ROS (+)   Anesthesia Other Findings   Reproductive/Obstetrics                             Anesthesia Physical Anesthesia Plan  ASA: 2  Anesthesia Plan: General   Post-op Pain Management:    Induction: Intravenous  PONV Risk Score and Plan: 3 and Propofol infusion, TIVA and Treatment may vary due to age or medical condition  Airway Management Planned: Natural Airway  Additional Equipment:   Intra-op Plan:   Post-operative Plan:   Informed Consent: I have reviewed the patients History and Physical, chart, labs and discussed the procedure including the risks, benefits and alternatives for the proposed anesthesia with the patient or authorized representative who has indicated his/her understanding and acceptance.       Plan Discussed with: CRNA  Anesthesia Plan Comments: (LMA/GETA backup discussed.  Patient consented for risks of anesthesia including but not limited to:  - adverse reactions to medications - damage to eyes, teeth, lips or other oral mucosa - nerve damage due to positioning  - sore  throat or hoarseness - damage to heart, brain, nerves, lungs, other parts of body or loss of life  Informed patient about role of CRNA in peri- and intra-operative care.  Patient voiced understanding.)       Anesthesia Quick Evaluation

## 2022-11-11 NOTE — Op Note (Signed)
[facility] Patient Name: Brittany Patton Procedure Date: 11/11/2022 10:44 AM MRN: [id number] Account #: [id number] Date of Birth: 14-Jan-1961 Admit Type: Outpatient Age: 62 Room: [facility] OR ROOM 01 Gender: Female Note Status: Finalized Instrument Name: [id number] Procedure:             Colonoscopy Indications:           Surveillance: Personal history of adenomatous polyps                         on last colonoscopy > 5 years ago, Last colonoscopy:                         January 2015 Providers:             Lin Landsman MD, MD Referring MD:          Juline Patch, MD (Referring MD) Medicines:             General Anesthesia Complications:         No immediate complications. Estimated blood loss: None. Procedure:             Pre-Anesthesia Assessment:                        - Prior to the procedure, a History and Physical was                         performed, and patient medications and allergies were                         reviewed. The patient is competent. The risks and                         benefits of the procedure and the sedation options and                         risks were discussed with the patient. All questions                         were answered and informed consent was obtained.                         Patient identification and proposed procedure were                         verified by the physician, the nurse, the                         anesthesiologist, the anesthetist and the technician                         in the pre-procedure area in the procedure room in the                         endoscopy suite. Mental Status Examination: alert and                         oriented. Airway Examination: normal oropharyngeal  airway and neck mobility. Respiratory Examination:                         clear to auscultation. CV Examination: normal.                         Prophylactic Antibiotics: The patient does  not require                         prophylactic antibiotics. Prior Anticoagulants: The                         patient has taken no anticoagulant or antiplatelet                         agents. ASA Grade Assessment: II - A patient with mild                         systemic disease. After reviewing the risks and                         benefits, the patient was deemed in satisfactory                         condition to undergo the procedure. The anesthesia                         plan was to use general anesthesia. Immediately prior                         to administration of medications, the patient was                         re-assessed for adequacy to receive sedatives. The                         heart rate, respiratory rate, oxygen saturations,                         blood pressure, adequacy of pulmonary ventilation, and                         response to care were monitored throughout the                         procedure. The physical status of the patient was                         re-assessed after the procedure.                        After obtaining informed consent, the colonoscope was                         passed under direct vision. Throughout the procedure,                         the patient's blood pressure, pulse, and oxygen  saturations were monitored continuously. The was                         introduced through the anus and advanced to the the                         cecum, identified by appendiceal orifice and ileocecal                         valve. The colonoscopy was performed without                         difficulty. The patient tolerated the procedure well.                         The quality of the bowel preparation was evaluated                         using the BBPS [facility] Bowel Preparation Scale) with                         scores of: Right Colon = 3, Transverse Colon = 3 and                         Left Colon = 3 (entire  mucosa seen well with no                         residual staining, small fragments of stool or opaque                         liquid). The total BBPS score equals 9. The ileocecal                         valve, appendiceal orifice, and rectum were                         photographed. Findings:      Hemorrhoids were found on perianal exam.      Three sessile polyps were found in the sigmoid colon, transverse colon       and cecum. The polyps were 5 to 7 mm in size. These polyps were removed       with a cold snare. Resection and retrieval were complete. Estimated       blood loss was minimal.      Non-bleeding external and internal hemorrhoids were found during       retroflexion and during perianal exam. The hemorrhoids were large and       Grade IV (internal hemorrhoids that prolapse and cannot be reduced       manually). Impression:            - Hemorrhoids found on perianal exam.                        - Three 5 to 7 mm polyps in the sigmoid colon, in the                         transverse colon and in the cecum, removed with a cold  snare. Resected and retrieved.                        - Non-bleeding external and internal hemorrhoids. Recommendation:        - Discharge patient to home (with escort).                        - Resume previous diet today.                        - Continue present medications.                        - Await pathology results.                        - Repeat colonoscopy in 3 - 5 years for surveillance. Procedure Code(s):     --- Professional ---                        [phone], Colonoscopy, flexible; with removal of                         tumor(s), polyp(s), or other lesion(s) by snare                         technique Diagnosis Code(s):     --- Professional ---                        Z86.010, Personal history of colonic polyps                        D12.5, Benign neoplasm of sigmoid colon                        D12.3, Benign  neoplasm of transverse colon (hepatic                         flexure or splenic flexure)                        D12.0, Benign neoplasm of cecum                        K64.3, Fourth degree hemorrhoids CPT copyright 2022 American Medical Association. All rights reserved. The codes documented in this report are preliminary and upon coder review may  be revised to meet current compliance requirements. Dr. Ulyess Mort Lin Landsman MD, MD 11/11/2022 11:22:10 AM This report has been signed electronically. Number of Addenda: 0 Note Initiated On: 11/11/2022 10:44 AM Scope Withdrawal Time: 0 hours 16 minutes 17 seconds  Total Procedure Duration: 0 hours 20 minutes 43 seconds  Estimated Blood Loss:  Estimated blood loss was minimal.      [facility]

## 2022-11-11 NOTE — H&P (Signed)
Brittany Darby, MD [address]  [place]  [place], [place] [address]  Main: [phone]  Fax: [phone] Pager: [phone]  Primary Care Physician:  Juline Patch, MD Primary Gastroenterologist:  Dr. Cephas Patton  Pre-Procedure History & Physical: HPI:  Brittany Patton is a 62 y.o. female is here for an colonoscopy.   Past Medical History:  Diagnosis Date   Depression    GERD (gastroesophageal reflux disease)    Hyperlipidemia     Past Surgical History:  Procedure Laterality Date   CESAREAN SECTION     x 2   CHOLECYSTECTOMY, LAPAROSCOPIC  2015   COLONOSCOPY  2013   cleared for 10 yrs   TONSILLECTOMY      Prior to Admission medications   Medication Sig Start Date End Date Taking? Authorizing Provider  atorvastatin (LIPITOR) 20 MG tablet Take 1 tablet (20 mg total) by mouth daily. 09/22/22  Yes Juline Patch, MD  Sodium Sulfate-Mag Sulfate-KCl (SUTAB) [phone] MG TABS Take 12 tablets by mouth 2 (two) times daily. 09/24/22  Yes Darren Caldron, Tally Due, MD  albuterol (VENTOLIN HFA) 108 (90 Base) MCG/ACT inhaler Inhale 2 puffs into the lungs every 6 (six) hours as needed for wheezing or shortness of breath. Patient not taking: Reported on 11/02/2022 06/24/22   Juline Patch, MD  ezetimibe (ZETIA) 10 MG tablet Take 1 tablet (10 mg total) by mouth daily. Patient not taking: Reported on 11/02/2022 05/24/22   Juline Patch, MD  famotidine (PEPCID) 20 MG tablet Take 20 mg by mouth daily as needed for heartburn or indigestion. Patient not taking: Reported on 11/02/2022    [provider]  ondansetron (ZOFRAN) 4 MG tablet Take 1 tablet (4 mg total) by mouth every 8 (eight) hours as needed for nausea or vomiting. Patient not taking: Reported on 11/02/2022 06/28/22   Juline Patch, MD    Allergies as of 09/23/2022 - Review Complete 09/17/2022  Allergen Reaction Noted   Statins Other (See Comments) 10/29/2020   Sulfa antibiotics Hives 02/23/2012    Family  History  Problem Relation Age of Onset   Diabetes Mother    Breast cancer Maternal Aunt 59   Hypertension Father    Heart disease Father    Glaucoma Father     Social History   Socioeconomic History   Marital status: Widowed    Spouse name: Not on file   Number of children: Not on file   Years of education: Not on file   Highest education level: Not on file  Occupational History   Not on file  Tobacco Use   Smoking status: Former    Types: Cigarettes    Quit date: 07/06/1996    Years since quitting: 26.3   Smokeless tobacco: Never  Vaping Use   Vaping Use: Never used  Substance and Sexual Activity   Alcohol use: Yes    Alcohol/week: 0.0 standard drinks of alcohol    Comment: rarely   Drug use: No   Sexual activity: Not Currently    Birth control/protection: Post-menopausal  Other Topics Concern   Not on file  Social History Narrative   Not on file   Social Determinants of Health   Financial Resource Strain: Not on file  Food Insecurity: Not on file  Transportation Needs: Not on file  Physical Activity: Not on file  Stress: Not on file  Social Connections: Not on file  Intimate Partner Violence: Not on file    Review of Systems:  See HPI, otherwise negative ROS  Physical Exam: BP 134/76   Pulse 86   Temp 98.7 F (37.1 C) (Tympanic)   Resp (!) 23   Ht 5\' 2"  (1.575 m)   Wt 94.3 kg   SpO2 94%   BMI 38.04 kg/m  General:   Alert,  pleasant and cooperative in NAD Head:  Normocephalic and atraumatic. Neck:  Supple; no masses or thyromegaly. Lungs:  Clear throughout to auscultation.    Heart:  Regular rate and rhythm. Abdomen:  Soft, nontender and nondistended. Normal bowel sounds, without guarding, and without rebound.   Neurologic:  Alert and  oriented x4;  grossly normal neurologically.  Impression/Plan: [name] is here for an colonoscopy to be performed for h/o adenoma colon  Risks, benefits, limitations, and alternatives regarding   colonoscopy have been reviewed with the patient.  Questions have been answered.  All parties agreeable.   Sherri Sear, MD  11/11/2022, 10:42 AM

## 2022-11-11 NOTE — Anesthesia Postprocedure Evaluation (Signed)
Anesthesia Post Note  Patient: Brittany Patton  Procedure(s) Performed: COLONOSCOPY WITH PROPOFOL (Rectum)  Patient location during evaluation: PACU Anesthesia Type: General Level of consciousness: awake and alert, oriented and patient cooperative Pain management: pain level controlled Vital Signs Assessment: post-procedure vital signs reviewed and stable Respiratory status: spontaneous breathing, nonlabored ventilation and respiratory function stable Cardiovascular status: blood pressure returned to baseline and stable Postop Assessment: adequate PO intake Anesthetic complications: no   No notable events documented.   Last Vitals:  Vitals:   11/11/22 1124 11/11/22 1130  BP: 137/76 139/72  Pulse: 74 70  Resp: 18 16  Temp: (!) 36.4 C (!) 36.4 C  SpO2: 97% 97%    Last Pain:  Vitals:   11/11/22 1124  TempSrc:   PainSc: 0-No pain                 Darrin Nipper

## 2022-11-12 ENCOUNTER — Encounter: Payer: Self-pay | Admitting: Gastroenterology

## 2022-11-15 ENCOUNTER — Encounter: Payer: Self-pay | Admitting: Gastroenterology

## 2022-11-15 LAB — SURGICAL PATHOLOGY

## 2022-11-16 ENCOUNTER — Ambulatory Visit
Admission: RE | Admit: 2022-11-16 | Discharge: 2022-11-16 | Disposition: A | Discharge status: Home / Self Care | Payer: Medicaid Other | Source: Ambulatory Visit | Attending: Family Medicine | Admitting: Family Medicine

## 2022-11-16 DIAGNOSIS — Z1231 Encounter for screening mammogram for malignant neoplasm of breast: Secondary | ICD-10-CM | POA: Insufficient documentation

## 2022-11-17 ENCOUNTER — Other Ambulatory Visit: Payer: Self-pay | Admitting: Family Medicine

## 2022-11-17 DIAGNOSIS — N63 Unspecified lump in unspecified breast: Secondary | ICD-10-CM

## 2022-11-17 DIAGNOSIS — R928 Other abnormal and inconclusive findings on diagnostic imaging of breast: Secondary | ICD-10-CM

## 2022-11-22 ENCOUNTER — Ambulatory Visit
Admission: RE | Admit: 2022-11-22 | Discharge: 2022-11-22 | Disposition: A | Discharge status: Home / Self Care | Payer: Medicaid Other | Source: Ambulatory Visit | Attending: Family Medicine | Admitting: Family Medicine

## 2022-11-22 DIAGNOSIS — N63 Unspecified lump in unspecified breast: Secondary | ICD-10-CM

## 2022-11-22 DIAGNOSIS — N6323 Unspecified lump in the left breast, lower outer quadrant: Secondary | ICD-10-CM | POA: Diagnosis not present

## 2022-11-22 DIAGNOSIS — R922 Inconclusive mammogram: Secondary | ICD-10-CM | POA: Diagnosis not present

## 2022-11-22 DIAGNOSIS — R928 Other abnormal and inconclusive findings on diagnostic imaging of breast: Secondary | ICD-10-CM | POA: Diagnosis not present

## 2022-11-23 ENCOUNTER — Ambulatory Visit: Payer: Medicaid Other | Admitting: Family Medicine

## 2022-12-07 ENCOUNTER — Ambulatory Visit: Payer: Medicaid Other | Admitting: Family Medicine

## 2023-04-15 ENCOUNTER — Other Ambulatory Visit: Payer: Self-pay | Admitting: Family Medicine

## 2023-04-15 DIAGNOSIS — N63 Unspecified lump in unspecified breast: Secondary | ICD-10-CM

## 2023-04-15 DIAGNOSIS — R928 Other abnormal and inconclusive findings on diagnostic imaging of breast: Secondary | ICD-10-CM

## 2023-05-25 ENCOUNTER — Ambulatory Visit
Admission: RE | Admit: 2023-05-25 | Discharge: 2023-05-25 | Disposition: A | Discharge status: Home / Self Care | Payer: Medicaid Other | Source: Ambulatory Visit | Attending: Family Medicine | Admitting: Family Medicine

## 2023-05-25 DIAGNOSIS — N63 Unspecified lump in unspecified breast: Secondary | ICD-10-CM

## 2023-05-25 DIAGNOSIS — N6323 Unspecified lump in the left breast, lower outer quadrant: Secondary | ICD-10-CM | POA: Diagnosis not present

## 2023-05-25 DIAGNOSIS — R928 Other abnormal and inconclusive findings on diagnostic imaging of breast: Secondary | ICD-10-CM | POA: Insufficient documentation

## 2023-05-25 DIAGNOSIS — R92322 Mammographic fibroglandular density, left breast: Secondary | ICD-10-CM | POA: Diagnosis not present

## 2023-05-26 ENCOUNTER — Encounter: Payer: Self-pay | Admitting: Family Medicine

## 2023-06-03 ENCOUNTER — Encounter: Payer: Self-pay | Admitting: Family Medicine

## 2023-06-03 ENCOUNTER — Ambulatory Visit (INDEPENDENT_AMBULATORY_CARE_PROVIDER_SITE_OTHER): Payer: Medicaid Other | Admitting: Family Medicine

## 2023-06-03 ENCOUNTER — Ambulatory Visit
Admission: RE | Admit: 2023-06-03 | Discharge: 2023-06-03 | Disposition: A | Discharge status: Home / Self Care | Payer: Medicaid Other | Attending: Family Medicine | Admitting: Family Medicine

## 2023-06-03 ENCOUNTER — Ambulatory Visit
Admission: RE | Admit: 2023-06-03 | Discharge: 2023-06-03 | Disposition: A | Discharge status: Home / Self Care | Payer: Medicaid Other | Source: Ambulatory Visit | Attending: Family Medicine | Admitting: Family Medicine

## 2023-06-03 VITALS — BP 148/78 | HR 84 | Ht 62.5 in | Wt 216.0 lb

## 2023-06-03 DIAGNOSIS — K146 Glossodynia: Secondary | ICD-10-CM

## 2023-06-03 DIAGNOSIS — R053 Chronic cough: Secondary | ICD-10-CM | POA: Insufficient documentation

## 2023-06-03 DIAGNOSIS — R059 Cough, unspecified: Secondary | ICD-10-CM | POA: Diagnosis not present

## 2023-06-03 DIAGNOSIS — R432 Parageusia: Secondary | ICD-10-CM | POA: Diagnosis not present

## 2023-06-03 NOTE — Patient Instructions (Signed)
Patient is a 62 year old female who presents for a dysgeusia exam. The patient reports the following problems: nonproductive cough. Health maintenance has been reviewed up to date

## 2023-06-03 NOTE — Progress Notes (Signed)
Date:  06/03/2023   Name:  Brittany Patton   DOB:  09-19-1960   MRN:  [id number]   Chief Complaint: No chief complaint on file.  Cough This is a chronic problem. The current episode started more than 1 year ago. The problem has been gradually improving. The cough is Productive of purulent sputum. Pertinent negatives include no chest pain, chills, ear congestion, ear pain, fever, headaches, heartburn, hemoptysis, myalgias, nasal congestion, postnasal drip, rash, rhinorrhea, sore throat, shortness of breath, sweats, weight loss or wheezing.    Lab Results  Component Value Date   NA 139 06/11/2022   K 4.5 06/11/2022   CO2 23 06/11/2022   GLUCOSE 98 06/11/2022   BUN 13 06/11/2022   CREATININE 0.76 06/11/2022   CALCIUM 9.9 06/11/2022   EGFR 89 06/11/2022   GFRNONAA 83 01/11/2018   Lab Results  Component Value Date   CHOL 226 (H) 09/17/2022   HDL 49 09/17/2022   LDLCALC 129 (H) 09/17/2022   LDLDIRECT 186 (H) 11/26/2021   TRIG 238 (H) 09/17/2022   CHOLHDL 4.6 09/17/2022   Lab Results  Component Value Date   TSH 1.140 03/08/2018   No results found for: "HGBA1C" Lab Results  Component Value Date   WBC 9.5 09/12/2015   HGB 13.8 03/08/2018   HCT 42.3 09/12/2015   MCV 82 09/12/2015   PLT 426 (H) 09/12/2015   Lab Results  Component Value Date   ALT 17 06/11/2022   AST 20 06/11/2022   ALKPHOS 100 06/11/2022   BILITOT 0.7 06/11/2022   No results found for: "25OHVITD2", "25OHVITD3", "VD25OH"   Review of Systems  Constitutional:  Negative for chills, fever and weight loss.  HENT:  Positive for mouth sores. Negative for ear pain, hearing loss, nosebleeds, postnasal drip, rhinorrhea, sinus pressure and sore throat.        Smell normal  Respiratory:  Positive for cough. Negative for hemoptysis, shortness of breath and wheezing.   Cardiovascular:  Negative for chest pain, palpitations and leg swelling.  Gastrointestinal:  Negative for diarrhea, heartburn and nausea.   Musculoskeletal:  Negative for myalgias.  Skin:  Negative for rash.  Neurological:  Negative for headaches.    Patient Active Problem List   Diagnosis Date Noted   History of colonic polyps 11/11/2022   Cecal polyp 11/11/2022   Polyp of sigmoid colon 11/11/2022   Polyp of transverse colon 11/11/2022   Hyperlipidemia 04/13/2016   Esophageal reflux 04/13/2016    Allergies  Allergen Reactions   Statins Other (See Comments)    Leg cramps   Sulfa Antibiotics Hives    Past Surgical History:  Procedure Laterality Date   CESAREAN SECTION     x 2   CHOLECYSTECTOMY, LAPAROSCOPIC  2015   COLONOSCOPY  2013   cleared for 10 yrs   COLONOSCOPY WITH PROPOFOL N/A 11/11/2022   Procedure: COLONOSCOPY WITH PROPOFOL;  Surgeon: Toney Reil, MD;  Location: [facility];  Service: Endoscopy;  Laterality: N/A;   TONSILLECTOMY      Social History   Tobacco Use   Smoking status: Former    Current packs/day: 0.00    Types: Cigarettes    Quit date: 07/06/1996    Years since quitting: 26.9   Smokeless tobacco: Never  Vaping Use   Vaping status: Never Used  Substance Use Topics   Alcohol use: Yes    Alcohol/week: 0.0 standard drinks of alcohol    Comment: rarely   Drug use: No  Medication list has been reviewed and updated.  Current Meds  Medication Sig   atorvastatin (LIPITOR) 20 MG tablet Take 1 tablet (20 mg total) by mouth daily.       09/17/2022   11:00 AM 06/24/2022    2:48 PM 05/24/2022   10:40 AM 11/26/2021   11:07 AM  GAD 7 : Generalized Anxiety Score  Nervous, Anxious, on Edge 0 0 0 0  Control/stop worrying 0 0 0 0  Worry too much - different things 0 0 0 0  Trouble relaxing 0 0 0 0  Restless 0 0 0 0  Easily annoyed or irritable 0 0 0 0  Afraid - awful might happen 0 0 0 0  Total GAD 7 Score 0 0 0 0  Anxiety Difficulty Not difficult at all Not difficult at all Not difficult at all Not difficult at all       09/17/2022   11:00 AM 06/24/2022     2:47 PM 05/24/2022   10:40 AM  Depression screen PHQ 2/9  Decreased Interest 0 0 0  Down, Depressed, Hopeless 0 0 0  PHQ - 2 Score 0 0 0  Altered sleeping 0 0 0  Tired, decreased energy 0 0 0  Change in appetite 0 0 0  Feeling bad or failure about yourself  0 0 0  Trouble concentrating 0 0 0  Moving slowly or fidgety/restless 0 0 0  Suicidal thoughts 0 0 0  PHQ-9 Score 0 0 0  Difficult doing work/chores Not difficult at all Not difficult at all Not difficult at all    BP Readings from Last 3 Encounters:  06/03/23 (!) 148/78  11/11/22 139/72  09/17/22 124/68    Physical Exam Vitals and nursing note reviewed. Exam conducted with a chaperone present.  Constitutional:      General: She is not in acute distress.    Appearance: She is not diaphoretic.  HENT:     Head: Normocephalic and atraumatic.     Right Ear: Tympanic membrane, ear canal and external ear normal.     Left Ear: Tympanic membrane, ear canal and external ear normal.     Nose: Nose normal. No congestion or rhinorrhea.     Mouth/Throat:     Mouth: Mucous membranes are moist.     Pharynx: No oropharyngeal exudate.  Eyes:     General:        Right eye: No discharge.        Left eye: No discharge.     Conjunctiva/sclera: Conjunctivae normal.     Pupils: Pupils are equal, round, and reactive to light.  Neck:     Thyroid: No thyromegaly.     Vascular: No carotid bruit or JVD.  Cardiovascular:     Rate and Rhythm: Normal rate and regular rhythm.     Heart sounds: Normal heart sounds. No murmur heard.    No friction rub. No gallop. No S3 or S4 sounds.  Pulmonary:     Effort: Pulmonary effort is normal. No respiratory distress.     Breath sounds: Normal breath sounds. No wheezing, rhonchi or rales.  Abdominal:     General: Bowel sounds are normal.     Palpations: Abdomen is soft. There is no hepatomegaly, splenomegaly or mass.     Tenderness: There is no abdominal tenderness. There is no guarding.   Musculoskeletal:        General: Normal range of motion.     Cervical back: Normal range of motion  and neck supple.  Lymphadenopathy:     Head:     Right side of head: No submandibular adenopathy.     Left side of head: No submandibular adenopathy.     Cervical: No cervical adenopathy.     Right cervical: No superficial, deep or posterior cervical adenopathy.    Left cervical: No superficial, deep or posterior cervical adenopathy.     Upper Body:     Right upper body: No supraclavicular or axillary adenopathy.     Left upper body: No supraclavicular or axillary adenopathy.  Skin:    General: Skin is warm and dry.  Neurological:     Mental Status: She is alert.     Cranial Nerves: Cranial nerves 2-12 are intact.     Wt Readings from Last 3 Encounters:  06/03/23 216 lb (98 kg)  11/11/22 208 lb (94.3 kg)  09/17/22 207 lb (93.9 kg)    BP (!) 148/78   Pulse 84   Ht 5' 2.5" (1.588 m)   Wt 216 lb (98 kg)   SpO2 96%   BMI 38.88 kg/m   Assessment and Plan: 1. Dysgeusia New onset.  Persistent.  Predominantly of the left side.  Began several weeks ago when patient had some vesicles in her mouth.  Continues to have both distortion of taste without smell interruption as well as discomfort at the base of tongue projects along the lateral aspect of the left side of the tongue to the tip.  We will take a look at thiamine iron and TSH is the most common diseases that may be associated with this and refer to ear nose and throat for evaluation and possible new labs/imaging. - Vitamin B1 - Iron, TIBC and Ferritin Panel - TSH - Ambulatory referral to ENT  2. Tongue pain See above. - Ambulatory referral to ENT  3. Persistent cough - DG Chest 2 View  New onset of a persistent cough for 2 months.  Nonproductive in nature without dyspnea.  Will obtain chest x-ray to see if there is any chest concerns and particularly mediastinal lymphadenopathy that may be contributing to this to some  degree.   Elizabeth Sauer, MD

## 2023-06-04 ENCOUNTER — Encounter: Payer: Self-pay | Admitting: Family Medicine

## 2023-06-07 ENCOUNTER — Encounter: Payer: Self-pay | Admitting: Family Medicine

## 2023-06-07 DIAGNOSIS — R432 Parageusia: Secondary | ICD-10-CM | POA: Diagnosis not present

## 2023-06-07 DIAGNOSIS — R220 Localized swelling, mass and lump, head: Secondary | ICD-10-CM | POA: Diagnosis not present

## 2023-06-07 LAB — IRON,TIBC AND FERRITIN PANEL
Ferritin: 75 ng/mL (ref 15–150)
Iron Saturation: 8 % — CL (ref 15–55)
Iron: 35 ug/dL (ref 27–139)
Total Iron Binding Capacity: 433 ug/dL (ref 250–450)
UIBC: 398 ug/dL — ABNORMAL HIGH (ref 118–369)

## 2023-06-07 LAB — TSH: TSH: 0.741 u[IU]/mL (ref 0.450–4.500)

## 2023-06-07 LAB — VITAMIN B1: Thiamine: 200.2 nmol/L — ABNORMAL HIGH (ref 66.5–200.0)

## 2023-07-06 ENCOUNTER — Other Ambulatory Visit: Payer: Self-pay | Admitting: Family Medicine

## 2023-07-06 DIAGNOSIS — E782 Mixed hyperlipidemia: Secondary | ICD-10-CM

## 2023-07-07 NOTE — Telephone Encounter (Signed)
Requested Prescriptions  Pending Prescriptions Disp Refills   atorvastatin (LIPITOR) 20 MG tablet [Pharmacy Med Name: ATORVASTATIN CALCIUM 20MG  TABLET] 90 tablet 0    Sig: TAKE (1) TABLET BY MOUTH EVERY DAY     Cardiovascular:  Antilipid - Statins Failed - 07/06/2023  1:19 PM      Failed - Lipid Panel in normal range within the last 12 months    Cholesterol, Total  Date Value Ref Range Status  06/11/2022 248 (H) 100 - 199 mg/dL Final   Cholesterol  Date Value Ref Range Status  09/17/2022 226 (H) 0 - 200 mg/dL Final   LDL Chol Calc (NIH)  Date Value Ref Range Status  06/11/2022 151 (H) 0 - 99 mg/dL Final   LDL Cholesterol  Date Value Ref Range Status  09/17/2022 129 (H) 0 - 99 mg/dL Final    Comment:           Total Cholesterol/HDL:CHD Risk Coronary Heart Disease Risk Table                     Men   Women  1/2 Average Risk   3.4   3.3  Average Risk       5.0   4.4  2 X Average Risk   9.6   7.1  3 X Average Risk  23.4   11.0        Use the calculated Patient Ratio above and the CHD Risk Table to determine the patient's CHD Risk.        ATP III CLASSIFICATION (LDL):  <100     mg/dL   Optimal  161-096  mg/dL   Near or Above                    Optimal  130-159  mg/dL   Borderline  045-409  mg/dL   High  >811     mg/dL   Very High Performed at [facility], [address] [place], [place] [address]    LDL Direct  Date Value Ref Range Status  11/26/2021 186 (H) 0 - 99 mg/dL Final   HDL  Date Value Ref Range Status  09/17/2022 49 >40 mg/dL Final  29/56/2130 47 >86 mg/dL Final   Triglycerides  Date Value Ref Range Status  09/17/2022 238 (H) <150 mg/dL Final         Passed - Patient is not pregnant      Passed - Valid encounter within last 12 months    Recent Outpatient Visits           1 month ago Dysgeusia   [facility], [facility]   9 months ago Annual physical exam   [facility]   1 year ago Pneumonia of left lower lobe due to infectious organism   [facility]   1 year ago Acute cough   [facility]   1 year ago Acute non-recurrent sinusitis, unspecified location   [facility], [facility]

## 2023-08-29 ENCOUNTER — Ambulatory Visit (INDEPENDENT_AMBULATORY_CARE_PROVIDER_SITE_OTHER): Payer: Medicaid Other | Admitting: Family Medicine

## 2023-08-29 ENCOUNTER — Ambulatory Visit
Admission: RE | Admit: 2023-08-29 | Discharge: 2023-08-29 | Disposition: A | Discharge status: Home / Self Care | Payer: Medicaid Other | Source: Ambulatory Visit | Attending: Family Medicine | Admitting: Family Medicine

## 2023-08-29 ENCOUNTER — Other Ambulatory Visit: Payer: Self-pay | Admitting: Family Medicine

## 2023-08-29 ENCOUNTER — Encounter: Payer: Self-pay | Admitting: Family Medicine

## 2023-08-29 VITALS — BP 122/68 | HR 74 | Ht 64.0 in | Wt 219.0 lb

## 2023-08-29 DIAGNOSIS — R1011 Right upper quadrant pain: Secondary | ICD-10-CM | POA: Insufficient documentation

## 2023-08-29 DIAGNOSIS — N200 Calculus of kidney: Secondary | ICD-10-CM | POA: Insufficient documentation

## 2023-08-29 DIAGNOSIS — K4020 Bilateral inguinal hernia, without obstruction or gangrene, not specified as recurrent: Secondary | ICD-10-CM | POA: Diagnosis not present

## 2023-08-29 DIAGNOSIS — K7689 Other specified diseases of liver: Secondary | ICD-10-CM | POA: Diagnosis not present

## 2023-08-29 DIAGNOSIS — R350 Frequency of micturition: Secondary | ICD-10-CM | POA: Diagnosis not present

## 2023-08-29 DIAGNOSIS — R109 Unspecified abdominal pain: Secondary | ICD-10-CM | POA: Diagnosis not present

## 2023-08-29 LAB — POCT URINALYSIS DIPSTICK
Bilirubin, UA: 1
Glucose, UA: NEGATIVE
Ketones, UA: POSITIVE
Nitrite, UA: POSITIVE
Protein, UA: NEGATIVE
Spec Grav, UA: 1.015 (ref 1.010–1.025)
Urobilinogen, UA: 0.2 U/dL
pH, UA: 6 (ref 5.0–8.0)

## 2023-08-29 MED ORDER — TAMSULOSIN HCL 0.4 MG PO CAPS: 0.4000 mg | ORAL_CAPSULE | Freq: Every day | ORAL | 3 refills | Status: DC

## 2023-08-29 MED ORDER — TRAMADOL HCL 50 MG PO TABS: 50.0000 mg | ORAL_TABLET | Freq: Three times a day (TID) | ORAL | 0 refills | Status: AC | PRN

## 2023-08-29 NOTE — Progress Notes (Signed)
 Date:  08/29/2023   Name:  Brittany Patton   DOB:  1960-08-31   MRN:  [id number]   Chief Complaint: Hyperlipidemia, Back Pain, and side pain  Hyperlipidemia This is a chronic problem. The current episode started more than 1 year ago. The problem is controlled. Recent lipid tests were reviewed and are normal. She has no history of chronic renal disease, diabetes, hypothyroidism, liver disease, obesity or nephrotic syndrome. There are no known factors aggravating her hyperlipidemia. Pertinent negatives include no chest pain, focal sensory loss, focal weakness, leg pain, myalgias or shortness of breath. Current antihyperlipidemic treatment includes statins. The current treatment provides moderate improvement of lipids. There are no compliance problems.  Risk factors for coronary artery disease include dyslipidemia and hypertension.  Back Pain Pertinent negatives include no chest pain or leg pain.    Lab Results  Component Value Date   NA 139 06/11/2022   K 4.5 06/11/2022   CO2 23 06/11/2022   GLUCOSE 98 06/11/2022   BUN 13 06/11/2022   CREATININE 0.76 06/11/2022   CALCIUM  9.9 06/11/2022   EGFR 89 06/11/2022   GFRNONAA 83 01/11/2018   Lab Results  Component Value Date   CHOL 226 (H) 09/17/2022   HDL 49 09/17/2022   LDLCALC 129 (H) 09/17/2022   LDLDIRECT 186 (H) 11/26/2021   TRIG 238 (H) 09/17/2022   CHOLHDL 4.6 09/17/2022   Lab Results  Component Value Date   TSH 0.741 06/03/2023   No results found for: HGBA1C Lab Results  Component Value Date   WBC 9.5 09/12/2015   HGB 13.8 03/08/2018   HCT 42.3 09/12/2015   MCV 82 09/12/2015   PLT 426 (H) 09/12/2015   Lab Results  Component Value Date   ALT 17 06/11/2022   AST 20 06/11/2022   ALKPHOS 100 06/11/2022   BILITOT 0.7 06/11/2022   No results found for: 25OHVITD2, 25OHVITD3, VD25OH   Review of Systems  Respiratory:  Negative for shortness of breath.   Cardiovascular:  Negative for chest pain.   Musculoskeletal:  Positive for back pain. Negative for myalgias.  Neurological:  Negative for focal weakness.    Patient Active Problem List   Diagnosis Date Noted   History of colonic polyps 11/11/2022   Cecal polyp 11/11/2022   Polyp of sigmoid colon 11/11/2022   Polyp of transverse colon 11/11/2022   Hyperlipidemia 04/13/2016   Esophageal reflux 04/13/2016    Allergies  Allergen Reactions   Statins Other (See Comments)    Leg cramps   Sulfa Antibiotics Hives    Past Surgical History:  Procedure Laterality Date   CESAREAN SECTION     x 2   CHOLECYSTECTOMY, LAPAROSCOPIC  2015   COLONOSCOPY  2013   cleared for 10 yrs   COLONOSCOPY WITH PROPOFOL  N/A 11/11/2022   Procedure: COLONOSCOPY WITH PROPOFOL ;  Surgeon: Unk Corinn Skiff, MD;  Location: [facility];  Service: Endoscopy;  Laterality: N/A;   TONSILLECTOMY      Social History   Tobacco Use   Smoking status: Former    Current packs/day: 0.00    Types: Cigarettes    Quit date: 07/06/1996    Years since quitting: 27.1   Smokeless tobacco: Never  Vaping Use   Vaping status: Never Used  Substance Use Topics   Alcohol use: Yes    Alcohol/week: 0.0 standard drinks of alcohol    Comment: rarely   Drug use: No     Medication list has been reviewed and updated.  Current Meds  Medication Sig   atorvastatin  (LIPITOR) 20 MG tablet TAKE (1) TABLET BY MOUTH EVERY DAY       08/29/2023    2:14 PM 09/17/2022   11:00 AM 06/24/2022    2:48 PM 05/24/2022   10:40 AM  GAD 7 : Generalized Anxiety Score  Nervous, Anxious, on Edge 0 0 0 0  Control/stop worrying 0 0 0 0  Worry too much - different things 0 0 0 0  Trouble relaxing 0 0 0 0  Restless 0 0 0 0  Easily annoyed or irritable 0 0 0 0  Afraid - awful might happen 0 0 0 0  Total GAD 7 Score 0 0 0 0  Anxiety Difficulty Not difficult at all Not difficult at all Not difficult at all Not difficult at all       08/29/2023    2:14 PM 09/17/2022   11:00 AM  06/24/2022    2:47 PM  Depression screen PHQ 2/9  Decreased Interest 0 0 0  Down, Depressed, Hopeless 0 0 0  PHQ - 2 Score 0 0 0  Altered sleeping 0 0 0  Tired, decreased energy 3 0 0  Change in appetite 0 0 0  Feeling bad or failure about yourself  0 0 0  Trouble concentrating 0 0 0  Moving slowly or fidgety/restless 0 0 0  Suicidal thoughts 0 0 0  PHQ-9 Score 3 0 0  Difficult doing work/chores Somewhat difficult Not difficult at all Not difficult at all    BP Readings from Last 3 Encounters:  08/29/23 122/68  06/03/23 (!) 148/78  11/11/22 139/72    Physical Exam Vitals and nursing note reviewed. Exam conducted with a chaperone present.  Constitutional:      General: She is not in acute distress.    Appearance: She is not diaphoretic.  HENT:     Head: Normocephalic and atraumatic.     Right Ear: External ear normal.     Left Ear: External ear normal.     Nose: Nose normal.  Eyes:     General:        Right eye: No discharge.        Left eye: No discharge.     Conjunctiva/sclera: Conjunctivae normal.     Pupils: Pupils are equal, round, and reactive to light.  Neck:     Thyroid : No thyromegaly.     Vascular: No JVD.  Cardiovascular:     Rate and Rhythm: Normal rate and regular rhythm.     Heart sounds: Normal heart sounds. No murmur heard.    No friction rub. No gallop.  Pulmonary:     Effort: Pulmonary effort is normal.     Breath sounds: Normal breath sounds.  Abdominal:     General: Bowel sounds are normal.     Palpations: Abdomen is soft. There is no mass.     Tenderness: There is no abdominal tenderness. There is no right CVA tenderness, left CVA tenderness or guarding.  Musculoskeletal:        General: Normal range of motion.     Cervical back: Normal range of motion and neck supple.  Lymphadenopathy:     Cervical: No cervical adenopathy.  Skin:    General: Skin is warm and dry.  Neurological:     Mental Status: She is alert.     Deep Tendon Reflexes:  Reflexes are normal and symmetric.     Wt Readings from Last 3 Encounters:  08/29/23 219 lb (99.3 kg)  06/03/23 216 lb (98 kg)  11/11/22 208 lb (94.3 kg)    BP 122/68   Pulse 74   Ht 5' 4 (1.626 m)   Wt 219 lb (99.3 kg)   SpO2 99%   BMI 37.59 kg/m   Assessment and Plan:  1. Frequency of urination (Primary) New onset.  Persistent.  Patient's been drinking more water  but I think is the other way around is that she has been having frequency and she has been drinking water  because of the pain.  There is no suprapubic tenderness nor CVA tenderness.  Will proceed with control of pain initiation of Flomax  and begin diagnostic workup for kidney stone in progress. - Urine Culture  2. Nephrolithiasis New onset.  Persistent.  There is flank pain that began in the right posterior upper quadrant is wrapping around the right flank and is proceeding towards the bladder area.  This is consistent with a nephrolithiasis in transit and we will treat with tramadol  for pain 50 mg every 8 hours as well as Flomax  to enhance urinalysis and proceed with diagnostics of a stat CT for kidney stone. - traMADol  (ULTRAM ) 50 MG tablet; Take 1 tablet (50 mg total) by mouth every 8 (eight) hours as needed for up to 5 days.  Dispense: 15 tablet; Refill: 0 - tamsulosin  (FLOMAX ) 0.4 MG CAPS capsule; Take 1 capsule (0.4 mg total) by mouth daily.  Dispense: 30 capsule; Refill: 3 - POCT urinalysis dipstick - CT ABDOMEN PELVIS WO CONTRAST - Urine Culture  3. Right upper quadrant abdominal pain Right upper quadrant pain consistent with nephrolithiasis we are proceeding with further evaluation with stat CT. - CT ABDOMEN PELVIS WO CONTRAST - Urine Culture    Cathryne Molt, MD

## 2023-08-30 ENCOUNTER — Other Ambulatory Visit: Payer: Medicaid Other

## 2023-08-30 ENCOUNTER — Encounter: Payer: Self-pay | Admitting: Family Medicine

## 2023-08-31 ENCOUNTER — Other Ambulatory Visit: Payer: Self-pay

## 2023-08-31 ENCOUNTER — Encounter: Payer: Self-pay | Admitting: Family Medicine

## 2023-08-31 LAB — URINE CULTURE

## 2023-08-31 MED ORDER — CYCLOBENZAPRINE HCL 10 MG PO TABS: 10.0000 mg | ORAL_TABLET | Freq: Three times a day (TID) | ORAL | 0 refills | Status: DC | PRN

## 2023-10-26 ENCOUNTER — Other Ambulatory Visit: Payer: Self-pay | Admitting: Family Medicine

## 2023-10-26 DIAGNOSIS — N63 Unspecified lump in unspecified breast: Secondary | ICD-10-CM

## 2023-10-26 DIAGNOSIS — N6320 Unspecified lump in the left breast, unspecified quadrant: Secondary | ICD-10-CM

## 2023-10-26 DIAGNOSIS — Z1231 Encounter for screening mammogram for malignant neoplasm of breast: Secondary | ICD-10-CM

## 2023-12-01 ENCOUNTER — Ambulatory Visit
Admission: RE | Admit: 2023-12-01 | Discharge: 2023-12-01 | Disposition: A | Discharge status: Home / Self Care | Source: Ambulatory Visit | Attending: Family Medicine | Admitting: Family Medicine

## 2023-12-01 DIAGNOSIS — Z1231 Encounter for screening mammogram for malignant neoplasm of breast: Secondary | ICD-10-CM | POA: Insufficient documentation

## 2023-12-01 DIAGNOSIS — N6320 Unspecified lump in the left breast, unspecified quadrant: Secondary | ICD-10-CM

## 2023-12-01 DIAGNOSIS — N6323 Unspecified lump in the left breast, lower outer quadrant: Secondary | ICD-10-CM | POA: Diagnosis not present

## 2023-12-01 DIAGNOSIS — N63 Unspecified lump in unspecified breast: Secondary | ICD-10-CM | POA: Insufficient documentation

## 2023-12-01 DIAGNOSIS — R92323 Mammographic fibroglandular density, bilateral breasts: Secondary | ICD-10-CM | POA: Diagnosis not present

## 2023-12-01 DIAGNOSIS — R928 Other abnormal and inconclusive findings on diagnostic imaging of breast: Secondary | ICD-10-CM | POA: Diagnosis not present

## 2023-12-22 ENCOUNTER — Ambulatory Visit (INDEPENDENT_AMBULATORY_CARE_PROVIDER_SITE_OTHER): Admitting: Family Medicine

## 2023-12-22 ENCOUNTER — Encounter: Payer: Self-pay | Admitting: Family Medicine

## 2023-12-22 ENCOUNTER — Ambulatory Visit: Admitting: Family Medicine

## 2023-12-22 VITALS — BP 138/76 | HR 79 | Ht 64.0 in | Wt 215.2 lb

## 2023-12-22 DIAGNOSIS — Z0289 Encounter for other administrative examinations: Secondary | ICD-10-CM | POA: Diagnosis not present

## 2023-12-22 DIAGNOSIS — E782 Mixed hyperlipidemia: Secondary | ICD-10-CM | POA: Diagnosis not present

## 2023-12-22 MED ORDER — ATORVASTATIN CALCIUM 20 MG PO TABS: 20.0000 mg | ORAL_TABLET | Freq: Every day | ORAL | 1 refills | Status: DC

## 2023-12-22 NOTE — Progress Notes (Signed)
 Date:  12/22/2023   Name:  Brittany Patton   DOB:  30-Dec-1960   MRN:  [id number]   Chief Complaint: paper work (Patient presents today to have paper work for work to be filled out. )  Hyperlipidemia This is a chronic problem. The current episode started more than 1 year ago. The problem is controlled. Recent lipid tests were reviewed and are normal. Exacerbating diseases include hypothyroidism. She has no history of diabetes or liver disease. There are no known factors aggravating her hyperlipidemia. Pertinent negatives include no focal sensory loss, leg pain or myalgias. Current antihyperlipidemic treatment includes statins. The current treatment provides moderate improvement of lipids. There are no compliance problems.  Risk factors for coronary artery disease include hypertension.    Lab Results  Component Value Date   NA 139 06/11/2022   K 4.5 06/11/2022   CO2 23 06/11/2022   GLUCOSE 98 06/11/2022   BUN 13 06/11/2022   CREATININE 0.76 06/11/2022   CALCIUM  9.9 06/11/2022   EGFR 89 06/11/2022   GFRNONAA 83 01/11/2018   Lab Results  Component Value Date   CHOL 226 (H) 09/17/2022   HDL 49 09/17/2022   LDLCALC 129 (H) 09/17/2022   LDLDIRECT 186 (H) 11/26/2021   TRIG 238 (H) 09/17/2022   CHOLHDL 4.6 09/17/2022   Lab Results  Component Value Date   TSH 0.741 06/03/2023   No results found for: "HGBA1C" Lab Results  Component Value Date   WBC 9.5 09/12/2015   HGB 13.8 03/08/2018   HCT 42.3 09/12/2015   MCV 82 09/12/2015   PLT 426 (H) 09/12/2015   Lab Results  Component Value Date   ALT 17 06/11/2022   AST 20 06/11/2022   ALKPHOS 100 06/11/2022   BILITOT 0.7 06/11/2022   No results found for: "25OHVITD2", "25OHVITD3", "VD25OH"   Review of Systems  Musculoskeletal:  Negative for myalgias.    Patient Active Problem List   Diagnosis Date Noted   History of colonic polyps 11/11/2022   Cecal polyp 11/11/2022   Polyp of sigmoid colon 11/11/2022   Polyp of transverse  colon 11/11/2022   Hyperlipidemia 04/13/2016   Esophageal reflux 04/13/2016    Allergies  Allergen Reactions   Statins Other (See Comments)    Leg cramps   Sulfa Antibiotics Hives    Past Surgical History:  Procedure Laterality Date   CESAREAN SECTION     x 2   CHOLECYSTECTOMY, LAPAROSCOPIC  2015   COLONOSCOPY  2013   cleared for 10 yrs   COLONOSCOPY WITH PROPOFOL  N/A 11/11/2022   Procedure: COLONOSCOPY WITH PROPOFOL ;  Surgeon: Selena Daily, MD;  Location: [facility];  Service: Endoscopy;  Laterality: N/A;   TONSILLECTOMY      Social History   Tobacco Use   Smoking status: Former    Current packs/day: 0.00    Types: Cigarettes    Quit date: 07/06/1996    Years since quitting: 27.4   Smokeless tobacco: Never  Vaping Use   Vaping status: Never Used  Substance Use Topics   Alcohol use: Yes    Alcohol/week: 0.0 standard drinks of alcohol    Comment: rarely   Drug use: No     Medication list has been reviewed and updated.  No outpatient medications have been marked as taking for the 12/22/23 encounter (Office Visit) with Clarise Crooks, MD.       12/22/2023    1:50 PM 08/29/2023    2:14 PM 09/17/2022  11:00 AM 06/24/2022    2:48 PM  GAD 7 : Generalized Anxiety Score  Nervous, Anxious, on Edge 0 0 0 0  Control/stop worrying 0 0 0 0  Worry too much - different things 0 0 0 0  Trouble relaxing 0 0 0 0  Restless 0 0 0 0  Easily annoyed or irritable 0 0 0 0  Afraid - awful might happen 0 0 0 0  Total GAD 7 Score 0 0 0 0  Anxiety Difficulty Not difficult at all Not difficult at all Not difficult at all Not difficult at all       12/22/2023    1:50 PM 08/29/2023    2:14 PM 09/17/2022   11:00 AM  Depression screen PHQ 2/9  Decreased Interest 0 0 0  Down, Depressed, Hopeless 0 0 0  PHQ - 2 Score 0 0 0  Altered sleeping 0 0 0  Tired, decreased energy 0 3 0  Change in appetite 0 0 0  Feeling bad or failure about yourself  0 0 0  Trouble concentrating  0 0 0  Moving slowly or fidgety/restless 0 0 0  Suicidal thoughts 0 0 0  PHQ-9 Score 0 3 0  Difficult doing work/chores Not difficult at all Somewhat difficult Not difficult at all    BP Readings from Last 3 Encounters:  12/22/23 138/76  08/29/23 122/68  06/03/23 (!) 148/78    Physical Exam Vitals and nursing note reviewed.  Constitutional:      General: She is not in acute distress.    Appearance: She is not diaphoretic.  HENT:     Head: Normocephalic and atraumatic.     Right Ear: External ear normal.     Left Ear: External ear normal.     Nose: Nose normal.  Eyes:     General:        Right eye: No discharge.        Left eye: No discharge.     Conjunctiva/sclera: Conjunctivae normal.     Pupils: Pupils are equal, round, and reactive to light.  Neck:     Thyroid : No thyromegaly.     Vascular: No JVD.  Cardiovascular:     Rate and Rhythm: Normal rate and regular rhythm.     Heart sounds: Normal heart sounds, S1 normal and S2 normal. No murmur heard.    No systolic murmur is present.     No diastolic murmur is present.     No friction rub. No gallop. No S3 or S4 sounds.  Pulmonary:     Effort: Pulmonary effort is normal.     Breath sounds: Normal breath sounds. No decreased breath sounds, wheezing, rhonchi or rales.  Abdominal:     General: Bowel sounds are normal.     Palpations: Abdomen is soft. There is no hepatomegaly, splenomegaly or mass.     Tenderness: There is no abdominal tenderness. There is no guarding.  Musculoskeletal:        General: Normal range of motion.     Cervical back: Normal range of motion and neck supple.     Right lower leg: No edema.     Left lower leg: No edema.  Lymphadenopathy:     Cervical: No cervical adenopathy.  Skin:    General: Skin is warm and dry.  Neurological:     Mental Status: She is alert.     Wt Readings from Last 3 Encounters:  12/22/23 215 lb 3.2 oz (97.6 kg)  08/29/23 219 lb (99.3 kg)  06/03/23 216 lb (98 kg)     BP 138/76   Pulse 79   Ht 5\' 4"  (1.626 m)   Wt 215 lb 3.2 oz (97.6 kg)   SpO2 97%   BMI 36.94 kg/m   Assessment and Plan: 1. Mixed hyperlipidemia (Primary) Chronic.  Controlled.  Stable.  Asymptomatic.  Tolerating atorvastatin  20 mg once a day.  And we will continue at this dosing.  Patient demonstrates no muscle aches or myalgias originally had some leg pain but this was unrelated to be continued on the atorvastatin  with no issues.  We will check CMP for electrolytes and hepatic concerns and lipid panel for current level of LDL control and will recheck patient in 6 months - Comprehensive metabolic panel with GFR - Lipid Panel With LDL/HDL Ratio - atorvastatin  (LIPITOR) 20 MG tablet; Take 1 tablet (20 mg total) by mouth daily.  Dispense: 90 tablet; Refill: 1  2. Encounter for physical examination related to employment Patient approaches in and needs a form filled out for social services patient has no communicable disease no risk of tuberculosis exposure or current disease no limitations on physical exam and no mental concerns that would negate responsibilities towards care of patient.  Patient does not need QuantiFERON gold since there is no risk of tuberculosis and she is asymptomatic.   Alayne Allis, MD

## 2023-12-23 ENCOUNTER — Encounter: Payer: Self-pay | Admitting: Family Medicine

## 2023-12-23 ENCOUNTER — Other Ambulatory Visit: Payer: Self-pay | Admitting: Family Medicine

## 2023-12-23 LAB — COMPREHENSIVE METABOLIC PANEL WITH GFR
ALT: 14 IU/L (ref 0–32)
AST: 13 IU/L (ref 0–40)
Albumin: 4.3 g/dL (ref 3.9–4.9)
Alkaline Phosphatase: 113 IU/L (ref 44–121)
BUN/Creatinine Ratio: 21 (ref 12–28)
BUN: 16 mg/dL (ref 8–27)
Bilirubin Total: 0.5 mg/dL (ref 0.0–1.2)
CO2: 24 mmol/L (ref 20–29)
Calcium: 10.1 mg/dL (ref 8.7–10.3)
Chloride: 104 mmol/L (ref 96–106)
Creatinine, Ser: 0.78 mg/dL (ref 0.57–1.00)
Globulin, Total: 2.5 g/dL (ref 1.5–4.5)
Glucose: 95 mg/dL (ref 70–99)
Potassium: 4.9 mmol/L (ref 3.5–5.2)
Sodium: 140 mmol/L (ref 134–144)
Total Protein: 6.8 g/dL (ref 6.0–8.5)
eGFR: 86 mL/min/{1.73_m2} (ref >59)

## 2023-12-23 LAB — LIPID PANEL WITH LDL/HDL RATIO
Cholesterol, Total: 248 mg/dL — ABNORMAL HIGH (ref 100–199)
HDL: 46 mg/dL (ref >39)
LDL Chol Calc (NIH): 150 mg/dL — ABNORMAL HIGH (ref 0–99)
LDL/HDL Ratio: 3.3 ratio — ABNORMAL HIGH (ref 0.0–3.2)
Triglycerides: 282 mg/dL — ABNORMAL HIGH (ref 0–149)
VLDL Cholesterol Cal: 52 mg/dL — ABNORMAL HIGH (ref 5–40)

## 2023-12-23 MED ORDER — ATORVASTATIN CALCIUM 40 MG PO TABS: 40.0000 mg | ORAL_TABLET | Freq: Every day | ORAL | 3 refills | Status: AC

## 2024-06-20 ENCOUNTER — Encounter: Admitting: Student

## 2024-09-14 ENCOUNTER — Ambulatory Visit
Admission: EM | Admit: 2024-09-14 | Discharge: 2024-09-14 | Disposition: A | Discharge status: Home / Self Care | Attending: Family Medicine | Admitting: Family Medicine

## 2024-09-14 ENCOUNTER — Encounter: Payer: Self-pay | Admitting: Emergency Medicine

## 2024-09-14 DIAGNOSIS — J0100 Acute maxillary sinusitis, unspecified: Secondary | ICD-10-CM | POA: Diagnosis not present

## 2024-09-14 DIAGNOSIS — H1032 Unspecified acute conjunctivitis, left eye: Secondary | ICD-10-CM | POA: Diagnosis not present

## 2024-09-14 DIAGNOSIS — J069 Acute upper respiratory infection, unspecified: Secondary | ICD-10-CM

## 2024-09-14 DIAGNOSIS — R030 Elevated blood-pressure reading, without diagnosis of hypertension: Secondary | ICD-10-CM | POA: Diagnosis not present

## 2024-09-14 LAB — POCT RAPID STREP A (OFFICE): Rapid Strep A Screen: NEGATIVE

## 2024-09-14 MED ORDER — AZITHROMYCIN 250 MG PO TABS: ORAL_TABLET | ORAL | 0 refills | Status: AC

## 2024-09-14 MED ORDER — POLYMYXIN B-TRIMETHOPRIM 10000-0.1 UNIT/ML-% OP SOLN: 2.0000 [drp] | Freq: Four times a day (QID) | OPHTHALMIC | 0 refills | Status: AC

## 2024-09-14 MED ORDER — PROMETHAZINE-DM 6.25-15 MG/5ML PO SYRP: 5.0000 mL | ORAL_SOLUTION | Freq: Four times a day (QID) | ORAL | 0 refills | Status: AC | PRN

## 2024-09-14 MED ORDER — PREDNISONE 20 MG PO TABS: 40.0000 mg | ORAL_TABLET | Freq: Every day | ORAL | 0 refills | Status: AC

## 2024-09-14 NOTE — ED Triage Notes (Signed)
 Patient c/o sinus congestion, pressure and drainage for 2 weeks.  Patient states that yesterday she woke up with left eye redness and drainage.  Patient denies recent fevers.

## 2024-09-14 NOTE — ED Provider Notes (Incomplete)
 " [facility]    CSN: [id number] Arrival date & time: 09/14/24  0830      History   Chief Complaint Chief Complaint  Patient presents with   Eye Drainage    left   Sinus Problem    HPI Brittany Patton is a 64 y.o. female.   HPI  History obtained from the patient. [name] presents for several concerns.   She believes she has left pink eye. Has itchy red eyes that started draining yesterday.  This morning, the eye was crusted over.  She wears glasses not contacts.  Has light sensitivity and occasional blurry vision due to the discharge.   Has nasal congestion and sneezing for the past 2 weeks. Thought last week she was getting better but now has sinus pressure and facial pain with headache.    Her grand-daughter had the flu a few weeks ago and she thinks she caught it too.  Had cough for two weeks and sore throat for a few days.  No rhinorrhea, vomiting and diarrhea.   No history of asthma. Former smoker who quit 30 years ago       Past Medical History:  Diagnosis Date   Depression    GERD (gastroesophageal reflux disease)    Hyperlipidemia     Patient Active Problem List   Diagnosis Date Noted   History of colonic polyps 11/11/2022   Cecal polyp 11/11/2022   Polyp of sigmoid colon 11/11/2022   Polyp of transverse colon 11/11/2022   Hyperlipidemia 04/13/2016   Esophageal reflux 04/13/2016    Past Surgical History:  Procedure Laterality Date   CESAREAN SECTION     x 2   CHOLECYSTECTOMY, LAPAROSCOPIC  2015   COLONOSCOPY  2013   cleared for 10 yrs   COLONOSCOPY WITH PROPOFOL  N/A 11/11/2022   Procedure: COLONOSCOPY WITH PROPOFOL ;  Surgeon: Unk Corinn Skiff, MD;  Location: [facility];  Service: Endoscopy;  Laterality: N/A;   TONSILLECTOMY      OB History     Gravida  2   Para  2   Term  2   Preterm      AB      Living  2      SAB      IAB      Ectopic      Multiple      Live Births  2             Home Medications    Prior to Admission medications  Medication Sig Start Date End Date Taking? Authorizing Provider  azithromycin  (ZITHROMAX  Z-PAK) 250 MG tablet Take 2 tablets on day 1 then 1 tablet daily 09/14/24  Yes Ming Kunka, DO  predniSONE  (DELTASONE ) 20 MG tablet Take 2 tablets (40 mg total) by mouth daily for 5 days. 09/14/24 09/19/24 Yes Moselle Rister, DO  promethazine -dextromethorphan (PROMETHAZINE -DM) 6.25-15 MG/5ML syrup Take 5 mLs by mouth 4 (four) times daily as needed. 09/14/24  Yes Arush Gatliff, DO  trimethoprim -polymyxin b  (POLYTRIM ) ophthalmic solution Place 2 drops into the left eye every 6 (six) hours for 7 days. 09/14/24 09/21/24 Yes Ellington Greenslade, DO  atorvastatin  (LIPITOR) 40 MG tablet Take 1 tablet (40 mg total) by mouth daily. 12/23/23   Joshua Cathryne BROCKS, MD    Family History Family History  Problem Relation Age of Onset   Diabetes Mother    Breast cancer Maternal Aunt 18   Hypertension Father    Heart disease Father    Glaucoma Father  Social History Social History[1]   Allergies   Statins and Sulfa antibiotics   Review of Systems Review of Systems: negative unless otherwise stated in HPI.      Physical Exam Triage Vital Signs ED Triage Vitals  Encounter Vitals Group     BP 09/14/24 0854 (!) 160/81     Girls Systolic BP Percentile --      Girls Diastolic BP Percentile --      Boys Systolic BP Percentile --      Boys Diastolic BP Percentile --      Pulse Rate 09/14/24 0854 82     Resp 09/14/24 0854 14     Temp 09/14/24 0854 98.1 F (36.7 C)     Temp Source 09/14/24 0854 Oral     SpO2 09/14/24 0854 98 %     Weight 09/14/24 0852 215 lb 2.7 oz (97.6 kg)     Height 09/14/24 0852 5' 4 (1.626 m)     Head Circumference --      Peak Flow --      Pain Score 09/14/24 0852 5     Pain Loc --      Pain Education --      Exclude from Growth Chart --    No data found.  Updated Vital Signs BP (!) 160/81 (BP Location: Left Arm)    Pulse 82   Temp 98.1 F (36.7 C) (Oral)   Resp 14   Ht 5' 4 (1.626 m)   Wt 97.6 kg   SpO2 98%   BMI 36.93 kg/m   Visual Acuity Right Eye Distance:   Left Eye Distance:   Bilateral Distance:    Right Eye Near:   Left Eye Near:    Bilateral Near:     Physical Exam GEN:     alert, non-toxic appearing female in no distress ***   HENT:  mucus membranes moist, oropharyngeal ***without lesions or ***erythema, no*** tonsillar hypertrophy or exudates, *** moderate erythematous edematous turbinates, ***clear nasal discharge, ***bilateral TM normal EYES:   pupils equal and reactive, ***no scleral injection or discharge NECK:  normal ROM, no ***lymphadenopathy, ***no meningismus   RESP:  no increased work of breathing, ***clear to auscultation bilaterally CVS:   regular rate ***and rhythm Skin:   warm and dry, no rash on visible skin***    UC Treatments / Results  Labs (all labs ordered are listed, but only abnormal results are displayed) Labs Reviewed  POCT RAPID STREP A (OFFICE) - Normal    EKG   Radiology No results found.   Procedures Procedures (including critical care time)  Medications Ordered in UC Medications - No data to display  Initial Impression / Assessment and Plan / UC Course  I have reviewed the triage vital signs and the nursing notes.  Pertinent labs & imaging results that were available during my care of the patient were reviewed by me and considered in my medical decision making (see chart for details).       Pt is a 64 y.o. female who presents for 2 weeks of respiratory symptoms. [name] is afebrile here. Satting well on room air. Overall pt is non-toxic appearing, well hydrated, without respiratory distress. Pulmonary exam is unremarkable.  POC COVID and influenza panel deferred due to duration of symptoms.  Strep PCR is negative. Treat maxillary sinusitis and ower respiratory tract infection with antibiotics and steroids as below.  Discussed  symptomatic treatment.  Promethazine  DM for cough. Typical duration of symptoms discussed.  She has evidence of left conjunctivitis.  Treat with Polytrim  eyedrops.  Return and ED precautions given and voiced understanding. Discussed MDM, treatment plan and plan for follow-up with patient who agrees with plan.     Final Clinical Impressions(s) / UC Diagnoses   Final diagnoses:  Acute non-recurrent maxillary sinusitis  Upper respiratory infection with cough and congestion     Discharge Instructions      Your strep test is negative.  Stop by the pharmacy to pick up your prescriptions.  Follow up with your primary care provider or return to the urgent care, if not improving.    Stop by the pharmacy to pick up your antibiotic eye medication.  Follow up with your primary eyecare provider or [facility] if symptoms suddenly worsen or you have little improvement in your eye symptoms.       ED Prescriptions     Medication Sig Dispense Auth. Provider   promethazine -dextromethorphan (PROMETHAZINE -DM) 6.25-15 MG/5ML syrup Take 5 mLs by mouth 4 (four) times daily as needed. 118 mL Yoselyn Mcglade, DO   azithromycin  (ZITHROMAX  Z-PAK) 250 MG tablet Take 2 tablets on day 1 then 1 tablet daily 6 tablet Janecia Palau, DO   predniSONE  (DELTASONE ) 20 MG tablet Take 2 tablets (40 mg total) by mouth daily for 5 days. 10 tablet Adine Heimann, DO   trimethoprim -polymyxin b  (POLYTRIM ) ophthalmic solution Place 2 drops into the left eye every 6 (six) hours for 7 days. 10 mL Kaylynne Andres, DO      PDMP not reviewed this encounter.       [1] Social History Tobacco Use   Smoking status: Former    Current packs/day: 0.00    Types: Cigarettes    Quit date: 07/06/1996    Years since quitting: 28.2   Smokeless tobacco: Never  Vaping Use   Vaping status: Never Used  Substance Use Topics   Alcohol use: Yes    Alcohol/week: 0.0 standard drinks of alcohol    Comment: rarely    Drug use: No  "

## 2024-09-14 NOTE — Discharge Instructions (Addendum)
 Your strep test is negative.  Stop by the pharmacy to pick up your prescriptions.  Follow up with your primary care provider or return to the urgent care, if not improving.    Stop by the pharmacy to pick up your antibiotic eye medication.  Follow up with your primary eyecare provider or [facility] if symptoms suddenly worsen or you have little improvement in your eye symptoms.

## 2024-09-20 ENCOUNTER — Encounter: Payer: Self-pay | Admitting: Family Medicine
